# Patient Record
Sex: Male | Born: 1960 | Race: White | Hispanic: No | Smoking: Never smoker
Health system: Southern US, Community
[De-identification: ages and names within clinical notes are randomized; demographics above are authoritative.]

## PROBLEM LIST (undated history)

## (undated) DIAGNOSIS — A6000 Herpesviral infection of urogenital system, unspecified: Secondary | ICD-10-CM

## (undated) DIAGNOSIS — I10 Essential (primary) hypertension: Secondary | ICD-10-CM

## (undated) DIAGNOSIS — N200 Calculus of kidney: Secondary | ICD-10-CM

## (undated) DIAGNOSIS — Z87442 Personal history of urinary calculi: Secondary | ICD-10-CM

## (undated) DIAGNOSIS — E785 Hyperlipidemia, unspecified: Secondary | ICD-10-CM

## (undated) DIAGNOSIS — N210 Calculus in bladder: Secondary | ICD-10-CM

## (undated) HISTORY — PX: SEPTOPLASTY: SHX2393

## (undated) HISTORY — DX: Hyperlipidemia, unspecified: E78.5

## (undated) HISTORY — DX: Calculus of kidney: N20.0

## (undated) HISTORY — DX: Essential (primary) hypertension: I10

---

## 1996-03-02 HISTORY — PX: VASECTOMY: SHX75

## 2010-03-02 HISTORY — PX: HEMORRHOID SURGERY: SHX153

## 2011-08-03 ENCOUNTER — Ambulatory Visit: Payer: Self-pay | Admitting: Surgery

## 2011-08-03 LAB — CBC
HCT: 40.5 % (ref 40.0–52.0)
HGB: 13.9 g/dL (ref 13.0–18.0)
MCH: 30.5 pg (ref 26.0–34.0)
MCHC: 34.4 g/dL (ref 32.0–36.0)
MCV: 89 fL (ref 80–100)
Platelet: 165 10*3/uL (ref 150–440)
RBC: 4.55 10*6/uL (ref 4.40–5.90)
WBC: 7.5 10*3/uL (ref 3.8–10.6)

## 2014-06-24 NOTE — Op Note (Signed)
PATIENT NAME:  James, Rush MR#:  [id number] DATE OF BIRTH:  12-24-1960  DATE OF PROCEDURE:  08/03/2011  PREOPERATIVE DIAGNOSIS: Hemorrhoids.  POSTOPERATIVE DIAGNOSIS: Hemorrhoids.   PROCEDURE: Hemorrhoidectomy.   SURGEON: Rochel Brome, MD  ANESTHESIA: General.   INDICATION: This 54 year old male has history of anal pain and examination findings of large internal and external hemorrhoids and surgery was recommended for definitive treatment.   DESCRIPTION OF PROCEDURE: The patient was placed on the operating table in the supine position under general anesthesia. The legs were elevated into the lithotomy position using ankle straps. The scrotum was retracted with paper tape. The anal area was prepared with Betadine solution and draped with sterile towels and sheets.   Initial inspection revealed that there was a large external hemorrhoid seen on the patient's right side which extended from approximately the 7 o'clock  to the 11 o'clock position. There was another large hemorrhoid on the patient's left side which extended from the 2 o'clock to 4 o'clock position and another smaller hemorrhoid at approximately the 5:30 position. The anoderm was infiltrated with 0.5% Sensorcaine with epinephrine. The anal canal was dilated large enough to admit three fingers. The bivalve anal retractor was introduced. There was a large internal component to the hemorrhoid, on the patient's right side, and also the one on the left side and a smaller internal hemorrhoid component with the one at the 5:30 position.   First the hemorrhoid on the patient's right side was removed by placing a 2-0 chromic suture ligature just above the internal hemorrhoid. A V-shaped incision was made externally as the skin was scored with a scalpel. Next, electrocautery was used to dissect the external hemorrhoids away from the surrounding subcutaneous tissues. This dissection was carried up over the internal anal sphincter and  dissected up to the previously placed suture ligature. The hemorrhoid was ligated with the same suture ligature and then amputated. The wound was inspected. Several small bleeding points were cauterized. Hemostasis was subsequently intact. Next, the wound was closed with a running locked tied 2-0 chromic suture and left a small opening in the skin for drainage.  A similar procedure was carried out on the patient's left side from the 2 o'clock to 4 o'clock position with a similar suture ligature, incision, dissection, and excision. The wound was inspected. Multiple small bleeding points were cauterized. The wound was closed with running locked tied 2-0 chromic with a small opening for drainage.   Next, a smaller similar procedure was carried out at the 5:30 position using a 3-0 chromic suture with similar ligation, excision, and closure.   Next, as the anal retractor was removed, the anal area was again prepared with Betadine solution and subsequently injected the anoderm and subcutaneous tissues with 20 mL of Exparel. Following this dressings were applied with paper tape. The patient tolerated surgery satisfactorily and was then prepared for transfer to the recovery room. ___________________________ Lenna Sciara. Rochel Brome, MD jws:slb D: 08/03/2011 09:50:36 ET T: 08/03/2011 10:44:11 ET JOB#: [id number]  cc: Loreli Dollar, MD, <Dictator> Loreli Dollar MD ELECTRONICALLY SIGNED 08/04/2011 18:47

## 2014-08-06 ENCOUNTER — Encounter: Payer: Self-pay | Admitting: Family Medicine

## 2014-08-06 ENCOUNTER — Ambulatory Visit (INDEPENDENT_AMBULATORY_CARE_PROVIDER_SITE_OTHER): Payer: No Typology Code available for payment source | Admitting: Family Medicine

## 2014-08-06 VITALS — BP 128/85 | HR 73 | Temp 98.3°F | Wt 258.0 lb

## 2014-08-06 DIAGNOSIS — I10 Essential (primary) hypertension: Secondary | ICD-10-CM

## 2014-08-06 DIAGNOSIS — R7301 Impaired fasting glucose: Secondary | ICD-10-CM | POA: Diagnosis not present

## 2014-08-06 MED ORDER — CARVEDILOL 25 MG PO TABS: 25.0000 mg | ORAL_TABLET | Freq: Two times a day (BID) | ORAL | Status: DC

## 2014-08-06 MED ORDER — LOSARTAN POTASSIUM 100 MG PO TABS: 100.0000 mg | ORAL_TABLET | Freq: Every day | ORAL | Status: DC

## 2014-08-06 MED ORDER — AMLODIPINE BESYLATE 10 MG PO TABS: 10.0000 mg | ORAL_TABLET | Freq: Every day | ORAL | Status: DC

## 2014-08-06 NOTE — Patient Instructions (Addendum)
Your goal blood pressure is less than 140/90. Try to limit the sodium in your diet.  Ideally, consume less than 1.5 grams (less than 1,500mg ) per day. Do not add salt when cooking or at the table.  Check the sodium amount on labels when shopping, and choose items lower in sodium when given a choice. Eat a diet rich in fruits and vegetables and whole grains. Return in 6 months for blood pressure and prediabetes. Good luck with modest weight loss.  DASH Eating Plan DASH stands for "Dietary Approaches to Stop Hypertension." The DASH eating plan is a healthy eating plan that has been shown to reduce high blood pressure (hypertension). Additional health benefits may include reducing the risk of type 2 diabetes mellitus, heart disease, and stroke. The DASH eating plan may also help with weight loss. WHAT DO I NEED TO KNOW ABOUT THE DASH EATING PLAN? For the DASH eating plan, you will follow these general guidelines:  Choose foods with a percent daily value for sodium of less than 5% (as listed on the food label).  Use salt-free seasonings or herbs instead of table salt or sea salt.  Check with your health care provider or pharmacist before using salt substitutes.  Eat lower-sodium products, often labeled as "lower sodium" or "no salt added."  Eat fresh foods.  Eat more vegetables, fruits, and low-fat dairy products.  Choose whole grains. Look for the word "whole" as the first word in the ingredient list.  Choose fish and skinless chicken or [place] more often than red meat. Limit fish, poultry, and meat to 6 oz (170 g) each day.  Limit sweets, desserts, sugars, and sugary drinks.  Choose heart-healthy fats.  Limit cheese to 1 oz (28 g) per day.  Eat more home-cooked food and less restaurant, buffet, and fast food.  Limit fried foods.  Cook foods using methods other than frying.  Limit canned vegetables. If you do use them, rinse them well to decrease the sodium.  When eating at a  restaurant, ask that your food be prepared with less salt, or no salt if possible. WHAT FOODS CAN I EAT? Seek help from a dietitian for individual calorie needs. Grains Whole grain or whole wheat bread. Brown rice. Whole grain or whole wheat pasta. Quinoa, bulgur, and whole grain cereals. Low-sodium cereals. Corn or whole wheat flour tortillas. Whole grain cornbread. Whole grain crackers. Low-sodium crackers. Vegetables Fresh or frozen vegetables (raw, steamed, roasted, or grilled). Low-sodium or reduced-sodium tomato and vegetable juices. Low-sodium or reduced-sodium tomato sauce and paste. Low-sodium or reduced-sodium canned vegetables.  Fruits All fresh, canned (in natural juice), or frozen fruits. Meat and Other Protein Products Ground beef (85% or leaner), grass-fed beef, or beef trimmed of fat. Skinless chicken or [place]. Ground chicken or [place]. Pork trimmed of fat. All fish and seafood. Eggs. Dried beans, peas, or lentils. Unsalted nuts and seeds. Unsalted canned beans. Dairy Low-fat dairy products, such as skim or 1% milk, 2% or reduced-fat cheeses, low-fat ricotta or cottage cheese, or plain low-fat yogurt. Low-sodium or reduced-sodium cheeses. Fats and Oils Tub margarines without trans fats. Light or reduced-fat mayonnaise and salad dressings (reduced sodium). Avocado. Safflower, olive, or canola oils. Natural peanut or almond butter. Other Unsalted popcorn and pretzels. The items listed above may not be a complete list of recommended foods or beverages. Contact your dietitian for more options. WHAT FOODS ARE NOT RECOMMENDED? Grains White bread. White pasta. White rice. Refined cornbread. Bagels and croissants. Crackers that contain trans fat.  Vegetables Creamed or fried vegetables. Vegetables in a cheese sauce. Regular canned vegetables. Regular canned tomato sauce and paste. Regular tomato and vegetable juices. Fruits Dried fruits. Canned fruit in light or heavy syrup. Fruit  juice. Meat and Other Protein Products Fatty cuts of meat. Ribs, chicken wings, bacon, sausage, bologna, salami, chitterlings, fatback, hot dogs, bratwurst, and packaged luncheon meats. Salted nuts and seeds. Canned beans with salt. Dairy Whole or 2% milk, cream, half-and-half, and cream cheese. Whole-fat or sweetened yogurt. Full-fat cheeses or blue cheese. Nondairy creamers and whipped toppings. Processed cheese, cheese spreads, or cheese curds. Condiments Onion and garlic salt, seasoned salt, table salt, and sea salt. Canned and packaged gravies. Worcestershire sauce. Tartar sauce. Barbecue sauce. Teriyaki sauce. Soy sauce, including reduced sodium. Steak sauce. Fish sauce. Oyster sauce. Cocktail sauce. Horseradish. Ketchup and mustard. Meat flavorings and tenderizers. Bouillon cubes. Hot sauce. Tabasco sauce. Marinades. Taco seasonings. Relishes. Fats and Oils Butter, stick margarine, lard, shortening, ghee, and bacon fat. Coconut, palm kernel, or palm oils. Regular salad dressings. Other Pickles and olives. Salted popcorn and pretzels. The items listed above may not be a complete list of foods and beverages to avoid. Contact your dietitian for more information. WHERE CAN I FIND MORE INFORMATION? National Heart, Lung, and [employer] Institute: travelstabloid.com Document Released: 02/05/2011 Document Revised: 07/03/2013 Document Reviewed: 12/21/2012 [facility] Patient Information 2015 [place], [place]. This information is not intended to replace advice given to you by your health care provider. Make sure you discuss any questions you have with your health care provider.

## 2014-08-06 NOTE — Assessment & Plan Note (Signed)
Encouraged DASH guidelines BP under much better control with new regimen Check BMP today Return in 6 months for next visit

## 2014-08-06 NOTE — Assessment & Plan Note (Signed)
Reviewed previous glucose readings; it appears he has had prediabetes or impaired fasting glucose for about 5 years Work on weight loss, healthy eating Recheck glucose fasting and A1C in 6 months

## 2014-08-06 NOTE — Progress Notes (Signed)
Patient ID: James Rush, male   DOB: 1960-07-27, 54 y.o.   MRN: [id number]   Subjective:   James Rush is a 54 y.o. 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 Hyperlipidemia   . Hypertension    Past Surgical History  Procedure Laterality Date  . Vasectomy    . Septoplasty    . Hemorrhoid surgery     Family History  Problem Relation Age of Onset  . Diabetes Mother   . Hypertension Mother    History  Substance Use Topics  . Smoking status: Never Smoker   . Smokeless tobacco: Never Used  . Alcohol Use: No   No current outpatient prescriptions on file prior to visit.   No current facility-administered medications on file prior to visit.   No Known Allergies ------------- Review of Systems  Respiratory: Negative for shortness of breath.   Cardiovascular: Negative for chest pain.     Lab Results  Component Value Date   WBC 7.5 08/03/2011   HGB 13.9 08/03/2011   HCT 40.5 08/03/2011   PLT 165 08/03/2011    Objective:   Filed Vitals:   08/06/14 1257  BP: 128/85  Pulse: 73  Temp: 98.3 F (36.8 C)  Weight: 258 lb (117.028 kg)  SpO2: 96%   Body mass index is 38.08 kg/(m^2). Physical Exam  Constitutional: He appears well-developed and well-nourished.  Eyes: EOM are normal. No scleral icterus.  Cardiovascular: Normal rate, regular rhythm and normal heart sounds.   No murmur heard. Pulmonary/Chest:  Effort normal and breath sounds normal. No respiratory distress.  Abdominal: He exhibits no distension.  Musculoskeletal: He exhibits no edema.  Skin: No rash noted. No pallor.  Psychiatric: He has a normal mood and affect.    Assessment/Plan:   Problem List Items Addressed This Visit      Cardiovascular and Mediastinum   Benign hypertension - Primary    Encouraged DASH guidelines BP under much better control with new regimen Check BMP today Return in 6 months for next visit      Relevant Medications   amLODipine (NORVASC) 10 MG tablet   carvedilol (COREG) 25 MG tablet   losartan (COZAAR) 100 MG tablet   Other Relevant Orders   Basic Metabolic Panel (BMET)     Endocrine   Impaired fasting glucose       Meds ordered this encounter  Medications  . amLODipine (NORVASC) 10 MG tablet    Sig: Take 1 tablet (10 mg total) by mouth daily.    Dispense:  90 tablet    Refill:  3  . carvedilol (COREG) 25 MG tablet    Sig: Take 1 tablet (25 mg total) by mouth 2 (two) times daily with a meal.    Dispense:  180 tablet    Refill:  1  . losartan (COZAAR) 100 MG tablet    Sig: Take 1 tablet (100 mg total)  by mouth daily.    Dispense:  90 tablet    Refill:  1    Follow up plan: Return in about 6 months (around 02/05/2015) for high blood pressure.   An After Visit Summary was printed and given to the patient.

## 2014-08-07 ENCOUNTER — Encounter: Payer: Self-pay | Admitting: Family Medicine

## 2014-08-07 LAB — BASIC METABOLIC PANEL
BUN/Creatinine Ratio: 10 (ref 9–20)
BUN: 11 mg/dL (ref 6–24)
CALCIUM: 9.6 mg/dL (ref 8.7–10.2)
CHLORIDE: 104 mmol/L (ref 97–108)
CO2: 21 mmol/L (ref 18–29)
CREATININE: 1.11 mg/dL (ref 0.76–1.27)
GFR calc non Af Amer: 74 mL/min/{1.73_m2} (ref >59)
GFR, EST AFRICAN AMERICAN: 86 mL/min/{1.73_m2} (ref >59)
Glucose: 90 mg/dL (ref 65–99)
Potassium: 4.5 mmol/L (ref 3.5–5.2)
Sodium: 140 mmol/L (ref 134–144)

## 2015-01-23 ENCOUNTER — Ambulatory Visit (INDEPENDENT_AMBULATORY_CARE_PROVIDER_SITE_OTHER): Payer: No Typology Code available for payment source | Admitting: Family Medicine

## 2015-01-23 ENCOUNTER — Encounter: Payer: Self-pay | Admitting: Family Medicine

## 2015-01-23 VITALS — BP 123/81 | HR 63 | Temp 98.2°F | Ht 68.0 in | Wt 249.0 lb

## 2015-01-23 DIAGNOSIS — R7301 Impaired fasting glucose: Secondary | ICD-10-CM | POA: Diagnosis not present

## 2015-01-23 DIAGNOSIS — Z23 Encounter for immunization: Secondary | ICD-10-CM

## 2015-01-23 DIAGNOSIS — I10 Essential (primary) hypertension: Secondary | ICD-10-CM

## 2015-01-23 MED ORDER — AMLODIPINE BESYLATE 10 MG PO TABS: 10.0000 mg | ORAL_TABLET | Freq: Every day | ORAL | Status: DC

## 2015-01-23 MED ORDER — LOSARTAN POTASSIUM 100 MG PO TABS: 100.0000 mg | ORAL_TABLET | Freq: Every day | ORAL | Status: DC

## 2015-01-23 MED ORDER — CARVEDILOL 25 MG PO TABS: 25.0000 mg | ORAL_TABLET | Freq: Two times a day (BID) | ORAL | Status: DC

## 2015-01-23 NOTE — Assessment & Plan Note (Signed)
The current medical regimen is effective;  continue present plan and medications.  

## 2015-01-23 NOTE — Assessment & Plan Note (Signed)
Discuss elevated sugars will recheck BMP today with attention to sugar Continue weight loss

## 2015-01-23 NOTE — Progress Notes (Signed)
BP 123/81 mmHg  Pulse 63  Temp(Src) 98.2 F (36.8 C)  Ht 5\' 8"  (1.727 m)  Wt 249 lb (112.946 kg)  BMI 37.87 kg/m2  SpO2 96%   Subjective:    Patient ID: James Rush, male    DOB: 16-Jun-1960, 54 y.o.   MRN: [id number]  HPI: James Rush is a 54 y.o. male  Chief Complaint  Patient presents with  . Hypertension   Patient taking blood pressure medicine without problems or side effects and taking faithfully  Patient is lost 11 pounds since this spring Concerned about elevated blood sugar last visit Patient with no other symptoms or complaints  Relevant past medical, surgical, family and social history reviewed and updated as indicated. Interim medical history since our last visit reviewed. Allergies and medications reviewed and updated.  Review of Systems  Constitutional: Negative.   Respiratory: Negative.   Cardiovascular: Negative.     Per HPI unless specifically indicated above     Objective:    BP 123/81 mmHg  Pulse 63  Temp(Src) 98.2 F (36.8 C)  Ht 5\' 8"  (1.727 m)  Wt 249 lb (112.946 kg)  BMI 37.87 kg/m2  SpO2 96%  Wt Readings from Last 3 Encounters:  01/23/15 249 lb (112.946 kg)  08/06/14 258 lb (117.028 kg)  07/16/14 260 lb (117.935 kg)    Physical Exam  Constitutional: He is oriented to person, place, and time. He appears well-developed and well-nourished. No distress.  HENT:  Head: Normocephalic and atraumatic.  Right Ear: Hearing normal.  Left Ear: Hearing normal.  Nose: Nose normal.  Eyes: Conjunctivae and lids are normal. Right eye exhibits no discharge. Left eye exhibits no discharge. No scleral icterus.  Cardiovascular: Normal rate, regular rhythm and normal heart sounds.   Pulmonary/Chest: Effort normal and breath sounds normal. No respiratory distress.  Musculoskeletal: Normal range of motion.  Neurological: He is alert and oriented to person, place, and time.  Skin: Skin is intact. No rash noted.  Psychiatric: He has a  normal mood and affect. His speech is normal and behavior is normal. Judgment and thought content normal. Cognition and memory are normal.    Results for orders placed or performed in visit on [id number]  Basic Metabolic Panel (BMET)  Result Value Ref Range   Glucose 90 65 - 99 mg/dL   BUN 11 6 - 24 mg/dL   Creatinine, Ser 1.11 0.76 - 1.27 mg/dL   GFR calc non Af Amer 74 >59 mL/min/1.73   GFR calc Af Amer 86 >59 mL/min/1.73   BUN/Creatinine Ratio 10 9 - 20   Sodium 140 134 - 144 mmol/L   Potassium 4.5 3.5 - 5.2 mmol/L   Chloride 104 97 - 108 mmol/L   CO2 21 18 - 29 mmol/L   Calcium 9.6 8.7 - 10.2 mg/dL      Assessment & Plan:   Problem List Items Addressed This Visit      Cardiovascular and Mediastinum   Essential hypertension    The current medical regimen is effective;  continue present plan and medications.       Relevant Medications   carvedilol (COREG) 25 MG tablet   losartan (COZAAR) 100 MG tablet   amLODipine (NORVASC) 10 MG tablet   Other Relevant Orders   Basic metabolic panel     Endocrine   Impaired fasting glucose    Discuss elevated sugars will recheck BMP today with attention to sugar Continue weight loss       Other  Visit Diagnoses    Immunization due    -  Primary    Relevant Orders    Flu Vaccine QUAD 36+ mos PF IM (Fluarix & Fluzone Quad PF) (Completed)    Benign hypertension        Relevant Medications    carvedilol (COREG) 25 MG tablet    losartan (COZAAR) 100 MG tablet    amLODipine (NORVASC) 10 MG tablet        Follow up plan: Return for Physical Exam.

## 2015-01-24 LAB — BASIC METABOLIC PANEL
BUN/Creatinine Ratio: 12 (ref 9–20)
BUN: 13 mg/dL (ref 6–24)
CO2: 25 mmol/L (ref 18–29)
CREATININE: 1.1 mg/dL (ref 0.76–1.27)
Calcium: 9.5 mg/dL (ref 8.7–10.2)
Chloride: 102 mmol/L (ref 97–106)
GFR calc Af Amer: 87 mL/min/{1.73_m2} (ref >59)
GFR calc non Af Amer: 75 mL/min/{1.73_m2} (ref >59)
Glucose: 88 mg/dL (ref 65–99)
Potassium: 4.5 mmol/L (ref 3.5–5.2)
SODIUM: 141 mmol/L (ref 136–144)

## 2015-08-14 ENCOUNTER — Other Ambulatory Visit: Payer: Self-pay | Admitting: Family Medicine

## 2015-08-29 ENCOUNTER — Encounter: Payer: Self-pay | Admitting: Family Medicine

## 2015-08-29 ENCOUNTER — Ambulatory Visit (INDEPENDENT_AMBULATORY_CARE_PROVIDER_SITE_OTHER): Payer: BLUE CROSS/BLUE SHIELD | Admitting: Family Medicine

## 2015-08-29 VITALS — BP 146/88 | HR 62 | Temp 98.4°F | Wt 257.0 lb

## 2015-08-29 DIAGNOSIS — H578 Other specified disorders of eye and adnexa: Secondary | ICD-10-CM

## 2015-08-29 DIAGNOSIS — H5789 Other specified disorders of eye and adnexa: Secondary | ICD-10-CM

## 2015-08-29 DIAGNOSIS — R238 Other skin changes: Secondary | ICD-10-CM | POA: Diagnosis not present

## 2015-08-29 DIAGNOSIS — I10 Essential (primary) hypertension: Secondary | ICD-10-CM

## 2015-08-29 MED ORDER — OFLOXACIN 0.3 % OP SOLN: 1.0000 [drp] | Freq: Four times a day (QID) | OPHTHALMIC | Status: DC

## 2015-08-29 MED ORDER — VALACYCLOVIR HCL 1 G PO TABS: 2000.0000 mg | ORAL_TABLET | Freq: Two times a day (BID) | ORAL | Status: DC

## 2015-08-29 NOTE — Progress Notes (Signed)
BP 146/88 mmHg  Pulse 62  Temp(Src) 98.4 F (36.9 C)  Wt 257 lb (116.574 kg)  SpO2 97%   Subjective:    Patient ID: James Rush, male    DOB: Jun 21, 1960, 55 y.o.   MRN: [id number]  HPI: James Rush is a 55 y.o. male  Chief Complaint  Patient presents with  . Eye Problem    left eye off and on for 6 months. Occasionally on the right eye. Will swell and the whites of eye starts to "bubble up". He would like to be tested for HSV.  Marland Kitchen Herpes    he's never been tested before, but thinks he has it and that might be what he has in his eye. He has had several outbreaks in his genital area.    Eye - Patient c/o left eye redness, gritty feeling, conjunctival blisters, and blurry vision. States he often hsa crust around his eyes when he wakes up in the morning during flares. 3rd episode in 6 months with left eye involvement. Wears contacts, which irritate eyes further. Episodes last a few days each time.  Is going back to ophthalmologist next month. Does not have contacts in today.   Blisters - 2-3 years ago, started noticing flares several times a year with blisters on his scrotum that last a few days before scabbing over and dissipate within a month. States they are sore and are painful. Denies having any outbreaks at this time. Also has had cold sores for years (as far back as he can remember).   HTN - Also admits to missing a dose of carvedilol nearly every day since it's the only one that's twice a day, feels that is why his BP is up. Patient trying to start doing better with this. Denies CP or dizziness.   Relevant past medical, surgical, family and social history reviewed and updated as indicated. Interim medical history since our last visit reviewed. Allergies and medications reviewed and updated.  Review of Systems  Constitutional: Negative.  Negative for fever and chills.  HENT: Negative for congestion, facial swelling and rhinorrhea.   Eyes: Positive for photophobia  (mild light sensitivity), pain (gritty feeling in left eye), discharge (clear discharge from left eye), redness and visual disturbance (blurry vision).  Respiratory: Negative.   Cardiovascular: Negative.   Genitourinary: Negative.   Musculoskeletal: Negative.   Neurological: Negative.  Negative for headaches.  Psychiatric/Behavioral: Negative.     Per HPI unless specifically indicated above     Objective:    BP 146/88 mmHg  Pulse 62  Temp(Src) 98.4 F (36.9 C)  Wt 257 lb (116.574 kg)  SpO2 97%  Wt Readings from Last 3 Encounters:  08/29/15 257 lb (116.574 kg)  01/23/15 249 lb (112.946 kg)  08/06/14 258 lb (117.028 kg)    Physical Exam  Constitutional: He is oriented to person, place, and time. He appears well-developed and well-nourished. No distress.  HENT:  Head: Atraumatic.  Nose: Nose normal.  Mouth/Throat: Oropharynx is clear and moist.  Eyes: EOM are normal. Pupils are equal, round, and reactive to light. Left eye exhibits discharge (Clear dicsharge ). No scleral icterus.  No redness or swelling present on eyelids - b/l eyes Right eye appears normal Left eye with diffusely red, inflamed cornea  Neck: Neck supple.  Cardiovascular: Normal rate, regular rhythm and normal heart sounds.   Pulmonary/Chest: Effort normal and breath sounds normal. No respiratory distress.  Abdominal: Soft.  Genitourinary:  **Patient declined scrotal exam as he  states he does not currently have any blistering   Musculoskeletal: Normal range of motion. He exhibits no edema or tenderness.  Lymphadenopathy:    He has no cervical adenopathy.  Neurological: He is alert and oriented to person, place, and time.  Skin: Skin is warm and dry. No rash noted.  Psychiatric: He has a normal mood and affect. His behavior is normal.    Results for orders placed or performed in visit on [id number]  Basic metabolic panel  Result Value Ref Range   Glucose 88 65 - 99 mg/dL   BUN 13 6 - 24 mg/dL    Creatinine, Ser 1.10 0.76 - 1.27 mg/dL   GFR calc non Af Amer 75 >59 mL/min/1.73   GFR calc Af Amer 87 >59 mL/min/1.73   BUN/Creatinine Ratio 12 9 - 20   Sodium 141 136 - 144 mmol/L   Potassium 4.5 3.5 - 5.2 mmol/L   Chloride 102 97 - 106 mmol/L   CO2 25 18 - 29 mmol/L   Calcium 9.5 8.7 - 10.2 mg/dL      Assessment & Plan:   Problem List Items Addressed This Visit      Cardiovascular and Mediastinum   Essential hypertension    Other Visit Diagnoses    Redness of eye, left    -  Primary    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 Patient due to see opthamologist next month for dilated eye exam.    Blisters of multiple sites        Likely HSV, await lab results. Advised patient to empirically treat with valtrex for 1-2 days until then.     Relevant Orders    HSV(herpes simplex vrs) 1+2 ab-IgG     In case of possible contact lens conjunctivitis component, sent in some ofloxacin drops for him to use during flares. Discussed contact lens hygiene with patient, especially making sure not to sleep in them overnight.    Follow up plan: Return if symptoms worsen or fail to improve.

## 2015-08-29 NOTE — Patient Instructions (Signed)
Call if symptoms don't resolve or worsen. Follow up as soon as possible with opthamologist.

## 2015-08-30 ENCOUNTER — Telehealth: Payer: Self-pay | Admitting: Family Medicine

## 2015-08-30 ENCOUNTER — Other Ambulatory Visit: Payer: Self-pay | Admitting: Family Medicine

## 2015-08-30 DIAGNOSIS — B009 Herpesviral infection, unspecified: Secondary | ICD-10-CM | POA: Insufficient documentation

## 2015-08-30 LAB — HSV(HERPES SIMPLEX VRS) I + II AB-IGG
HSV 1 GLYCOPROTEIN G AB, IGG: 36.7 {index} — AB (ref 0.00–0.90)
HSV 2 Glycoprotein G Ab, IgG: 15.2 index — ABNORMAL HIGH (ref 0.00–0.90)

## 2015-08-30 MED ORDER — VALACYCLOVIR HCL 1 G PO TABS: 1000.0000 mg | ORAL_TABLET | Freq: Every day | ORAL | Status: DC

## 2015-08-30 NOTE — Telephone Encounter (Signed)
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 Recommended he continue as dicussed at visit with suppressive valtrex dosing 2G BID for two days, and I have sent a second script to his pharmacy for maintenance therapy of 1G QD that he is to take regularly. Encouraged patient to move Opthalmology appointment up to as soon as possible next month to address possible HS Keratitis. He is agreeable and understands current plan.

## 2015-11-03 ENCOUNTER — Other Ambulatory Visit: Payer: Self-pay | Admitting: Family Medicine

## 2015-11-05 NOTE — Telephone Encounter (Signed)
Your patient 

## 2016-02-05 ENCOUNTER — Other Ambulatory Visit: Payer: Self-pay | Admitting: Family Medicine

## 2016-02-05 DIAGNOSIS — I10 Essential (primary) hypertension: Secondary | ICD-10-CM

## 2016-02-05 NOTE — Telephone Encounter (Signed)
apt 

## 2016-02-12 ENCOUNTER — Ambulatory Visit: Payer: BLUE CROSS/BLUE SHIELD | Admitting: Family Medicine

## 2016-02-12 ENCOUNTER — Ambulatory Visit (INDEPENDENT_AMBULATORY_CARE_PROVIDER_SITE_OTHER): Payer: BLUE CROSS/BLUE SHIELD | Admitting: Family Medicine

## 2016-02-12 ENCOUNTER — Encounter: Payer: Self-pay | Admitting: Family Medicine

## 2016-02-12 VITALS — BP 123/79 | HR 68 | Temp 98.1°F | Ht 70.0 in | Wt 261.6 lb

## 2016-02-12 DIAGNOSIS — R7301 Impaired fasting glucose: Secondary | ICD-10-CM | POA: Diagnosis not present

## 2016-02-12 DIAGNOSIS — Z23 Encounter for immunization: Secondary | ICD-10-CM

## 2016-02-12 DIAGNOSIS — Z6837 Body mass index (BMI) 37.0-37.9, adult: Secondary | ICD-10-CM | POA: Diagnosis not present

## 2016-02-12 DIAGNOSIS — I10 Essential (primary) hypertension: Secondary | ICD-10-CM

## 2016-02-12 DIAGNOSIS — Z0001 Encounter for general adult medical examination with abnormal findings: Secondary | ICD-10-CM

## 2016-02-12 DIAGNOSIS — Z0000 Encounter for general adult medical examination without abnormal findings: Secondary | ICD-10-CM

## 2016-02-12 LAB — MICROSCOPIC EXAMINATION: BACTERIA UA: NONE SEEN

## 2016-02-12 LAB — URINALYSIS, ROUTINE W REFLEX MICROSCOPIC
Bilirubin, UA: NEGATIVE
Glucose, UA: NEGATIVE
Ketones, UA: NEGATIVE
Nitrite, UA: NEGATIVE
PH UA: 6 (ref 5.0–7.5)
PROTEIN UA: NEGATIVE
Specific Gravity, UA: <1.005 — ABNORMAL LOW (ref 1.005–1.030)
UUROB: 0.2 mg/dL (ref 0.2–1.0)

## 2016-02-12 MED ORDER — LOSARTAN POTASSIUM 100 MG PO TABS: 100.0000 mg | ORAL_TABLET | Freq: Every day | ORAL | 4 refills | Status: DC

## 2016-02-12 MED ORDER — AMLODIPINE BESYLATE 10 MG PO TABS: 10.0000 mg | ORAL_TABLET | Freq: Every day | ORAL | 4 refills | Status: DC

## 2016-02-12 MED ORDER — CARVEDILOL 25 MG PO TABS: 25.0000 mg | ORAL_TABLET | Freq: Two times a day (BID) | ORAL | 4 refills | Status: DC

## 2016-02-12 NOTE — Progress Notes (Signed)
BP 123/79 (BP Location: Left Arm, Patient Position: Sitting, Cuff Size: Normal)   Pulse 68   Temp 98.1 F (36.7 C)   Ht 5\' 10"  (1.778 m)   Wt 261 lb 9.6 oz (118.7 kg)   SpO2 95%   BMI 37.54 kg/m    Subjective:    Patient ID: James Rush, male    DOB: 06/08/1960, 55 y.o.   MRN: [id number]  HPI: James Rush is a 55 y.o. male  Chief Complaint  Patient presents with  . Blood Pressure Check  . Medication Refill  Annual wellness exam Patient follow-up blood pressure taking blood pressure medications without problems or issues. Good blood pressure control. Discuss with patient 22 pound weight gain over this last year. Patient not drinking or other metabolic issues no musculoskeletal issues keeping him from moving patient admits to a just like to eat.  Relevant past medical, surgical, family and social history reviewed and updated as indicated. Interim medical history since our last visit reviewed. Allergies and medications reviewed and updated.  Review of Systems  Constitutional: Negative.   HENT: Negative.   Eyes: Negative.   Respiratory: Negative.   Cardiovascular: Negative.   Gastrointestinal: Negative.   Endocrine: Negative.   Genitourinary: Negative.   Musculoskeletal: Negative.   Skin: Negative.   Allergic/Immunologic: Negative.   Neurological: Negative.   Hematological: Negative.   Psychiatric/Behavioral: Negative.     Per HPI unless specifically indicated above     Objective:    BP 123/79 (BP Location: Left Arm, Patient Position: Sitting, Cuff Size: Normal)   Pulse 68   Temp 98.1 F (36.7 C)   Ht 5\' 10"  (1.778 m)   Wt 261 lb 9.6 oz (118.7 kg)   SpO2 95%   BMI 37.54 kg/m   Wt Readings from Last 3 Encounters:  02/12/16 261 lb 9.6 oz (118.7 kg)  08/29/15 257 lb (116.6 kg)  01/23/15 249 lb (112.9 kg)    Physical Exam  Constitutional: He is oriented to person, place, and time. He appears well-developed and well-nourished.  HENT:  Head:  Normocephalic and atraumatic.  Right Ear: External ear normal.  Left Ear: External ear normal.  Eyes: Conjunctivae and EOM are normal. Pupils are equal, round, and reactive to light.  Neck: Normal range of motion. Neck supple.  Cardiovascular: Normal rate, regular rhythm, normal heart sounds and intact distal pulses.   Pulmonary/Chest: Effort normal and breath sounds normal.  Abdominal: Soft. Bowel sounds are normal. There is no splenomegaly or hepatomegaly.  Genitourinary: Rectum normal, prostate normal and penis normal.  Musculoskeletal: Normal range of motion.  Neurological: He is alert and oriented to person, place, and time. He has normal reflexes.  Skin: No rash noted. No erythema.  Psychiatric: He has a normal mood and affect. His behavior is normal. Judgment and thought content normal.    Results for orders placed or performed in visit on 08/29/15  HSV(herpes simplex vrs) 1+2 ab-IgG  Result Value Ref Range   HSV 1 Glycoprotein G Ab, IgG 36.70 (H) 0.00 - 0.90 index   HSV 2 Glycoprotein G Ab, IgG 15.20 (H) 0.00 - 0.90 index      Assessment & Plan:   Problem List Items Addressed This Visit      Cardiovascular and Mediastinum   Essential hypertension    The current medical regimen is effective;  continue present plan and medications.       Relevant Medications   aspirin EC 81 MG tablet   losartan (  COZAAR) 100 MG tablet   carvedilol (COREG) 25 MG tablet   amLODipine (NORVASC) 10 MG tablet     Endocrine   Impaired fasting glucose    Labs pending        Other   BMI 37.0-37.9, adult    WT LOSS ESP WITH CONCERN ABOUT PREDIABETES       Other Visit Diagnoses    Need for tetanus booster    -  Primary   Relevant Orders   Td vaccine greater than or equal to 7yo preservative free IM (Completed)   Need for influenza vaccination       Relevant Orders   Flu Vaccine QUAD 36+ mos PF IM (Fluarix & Fluzone Quad PF) (Completed)   PE (physical exam), annual       Relevant  Orders   Comprehensive metabolic panel   Lipid panel   CBC with Differential/Platelet   TSH   Urinalysis, Routine w reflex microscopic   PSA       Follow up plan: Return in about 6 months (around 08/12/2016) for BMP.

## 2016-02-12 NOTE — Assessment & Plan Note (Signed)
The current medical regimen is effective;  continue present plan and medications.  

## 2016-02-12 NOTE — Assessment & Plan Note (Signed)
Labs pending.  

## 2016-02-12 NOTE — Assessment & Plan Note (Signed)
WT LOSS ESP WITH CONCERN ABOUT PREDIABETES

## 2016-02-13 LAB — CBC WITH DIFFERENTIAL/PLATELET
BASOS ABS: 0 10*3/uL (ref 0.0–0.2)
Basos: 0 %
EOS (ABSOLUTE): 0.2 10*3/uL (ref 0.0–0.4)
EOS: 3 %
HEMATOCRIT: 43 % (ref 37.5–51.0)
HEMOGLOBIN: 14.9 g/dL (ref 13.0–17.7)
Immature Grans (Abs): 0 10*3/uL (ref 0.0–0.1)
Immature Granulocytes: 0 %
LYMPHS ABS: 1.6 10*3/uL (ref 0.7–3.1)
LYMPHS: 27 %
MCH: 30.5 pg (ref 26.6–33.0)
MCHC: 34.7 g/dL (ref 31.5–35.7)
MCV: 88 fL (ref 79–97)
MONOCYTES: 11 %
Monocytes Absolute: 0.6 10*3/uL (ref 0.1–0.9)
NEUTROS PCT: 59 %
Neutrophils Absolute: 3.4 10*3/uL (ref 1.4–7.0)
Platelets: 199 10*3/uL (ref 150–379)
RBC: 4.89 x10E6/uL (ref 4.14–5.80)
RDW: 13.6 % (ref 12.3–15.4)
WBC: 5.9 10*3/uL (ref 3.4–10.8)

## 2016-02-13 LAB — LIPID PANEL
CHOL/HDL RATIO: 4.4 ratio (ref 0.0–5.0)
Cholesterol, Total: 166 mg/dL (ref 100–199)
HDL: 38 mg/dL — AB (ref >39)
LDL Calculated: 85 mg/dL (ref 0–99)
Triglycerides: 215 mg/dL — ABNORMAL HIGH (ref 0–149)
VLDL Cholesterol Cal: 43 mg/dL — ABNORMAL HIGH (ref 5–40)

## 2016-02-13 LAB — COMPREHENSIVE METABOLIC PANEL
A/G RATIO: 1.4 (ref 1.2–2.2)
ALT: 33 IU/L (ref 0–44)
AST: 22 IU/L (ref 0–40)
Albumin: 4.2 g/dL (ref 3.5–5.5)
Alkaline Phosphatase: 65 IU/L (ref 39–117)
BUN / CREAT RATIO: 10 (ref 9–20)
BUN: 10 mg/dL (ref 6–24)
Bilirubin Total: 0.8 mg/dL (ref 0.0–1.2)
CALCIUM: 9.4 mg/dL (ref 8.7–10.2)
CHLORIDE: 102 mmol/L (ref 96–106)
CO2: 27 mmol/L (ref 18–29)
Creatinine, Ser: 1.04 mg/dL (ref 0.76–1.27)
GFR, EST AFRICAN AMERICAN: 92 mL/min/{1.73_m2} (ref >59)
GFR, EST NON AFRICAN AMERICAN: 80 mL/min/{1.73_m2} (ref >59)
GLOBULIN, TOTAL: 2.9 g/dL (ref 1.5–4.5)
Glucose: 121 mg/dL — ABNORMAL HIGH (ref 65–99)
POTASSIUM: 4.6 mmol/L (ref 3.5–5.2)
Sodium: 142 mmol/L (ref 134–144)
TOTAL PROTEIN: 7.1 g/dL (ref 6.0–8.5)

## 2016-02-13 LAB — PSA: PROSTATE SPECIFIC AG, SERUM: 2.5 ng/mL (ref 0.0–4.0)

## 2016-02-13 LAB — TSH: TSH: 1.82 u[IU]/mL (ref 0.450–4.500)

## 2016-02-18 ENCOUNTER — Telehealth: Payer: Self-pay | Admitting: Family Medicine

## 2016-02-18 NOTE — Telephone Encounter (Signed)
Patient said he has received several calls from the office and he feels someone is trying to reach him with the results from his labs last week.    Thanks Santiago Glad

## 2016-02-18 NOTE — Telephone Encounter (Signed)
We did try to call the patient yesterday about labs. There is a result note from Dr. Jeananne Rama to call patient about labs. Will do that and route result note back to him for documentation.

## 2016-02-19 ENCOUNTER — Telehealth: Payer: Self-pay | Admitting: Family Medicine

## 2016-02-19 NOTE — Telephone Encounter (Signed)
Phone call reviewed patient's labs which are normal except for slightly elevated glucose patient was nonfasting.

## 2016-02-19 NOTE — Telephone Encounter (Signed)
-----   Message from Moss Mc, [place] sent at 02/19/2016  5:10 PM EST ----- Phone call.

## 2017-01-18 ENCOUNTER — Encounter: Payer: Self-pay | Admitting: Family Medicine

## 2017-01-18 ENCOUNTER — Ambulatory Visit (INDEPENDENT_AMBULATORY_CARE_PROVIDER_SITE_OTHER): Payer: BLUE CROSS/BLUE SHIELD | Admitting: Family Medicine

## 2017-01-18 VITALS — BP 142/76 | HR 84 | Ht 70.0 in | Wt 241.0 lb

## 2017-01-18 DIAGNOSIS — R319 Hematuria, unspecified: Secondary | ICD-10-CM

## 2017-01-18 DIAGNOSIS — I10 Essential (primary) hypertension: Secondary | ICD-10-CM | POA: Diagnosis not present

## 2017-01-18 DIAGNOSIS — Z23 Encounter for immunization: Secondary | ICD-10-CM

## 2017-01-18 LAB — URINALYSIS, ROUTINE W REFLEX MICROSCOPIC
BILIRUBIN UA: NEGATIVE
Glucose, UA: NEGATIVE
KETONES UA: NEGATIVE
Nitrite, UA: NEGATIVE
Protein, UA: NEGATIVE
UUROB: 0.2 mg/dL (ref 0.2–1.0)
pH, UA: 6 (ref 5.0–7.5)

## 2017-01-18 LAB — MICROSCOPIC EXAMINATION: Bacteria, UA: NONE SEEN

## 2017-01-18 MED ORDER — OLMESARTAN-AMLODIPINE-HCTZ 40-10-25 MG PO TABS: 1.0000 | ORAL_TABLET | Freq: Every morning | ORAL | 3 refills | Status: DC

## 2017-01-18 NOTE — Assessment & Plan Note (Signed)
Poor control and interested in changing medications. Will change to tribenzor 40-10-25 And recheck 1 month or so with BMP to assess blood pressure

## 2017-01-18 NOTE — Assessment & Plan Note (Signed)
For hematuria which isn't showing up today will refer to urology to further evaluate.

## 2017-01-18 NOTE — Progress Notes (Signed)
BP (!) 142/76   Pulse 84   Ht 5\' 10"  (1.778 m)   Wt 214 lb (97.1 kg)   SpO2 98%   BMI 30.71 kg/m    Subjective:    Patient ID: James Rush, male    DOB: Jul 25, 1960, 56 y.o.   MRN: [id number]  HPI: James Rush is a 56 y.o. male  Chief Complaint  Patient presents with  . Follow-up   Discuss hypertension patient having trouble taking carvedilol twice a day remembers the morning pill but not the evening pill.Blood pressures been elevated. Patient interested in tribenzor Patient also with hematuria after  Doing heavy lifting primarily moving a bunch of cut logs. This is happened 2 times patient's also had smelly urine. Patient has been doing low carbohydrate die  And maybe some ketosis.  Relevant past medical, surgical, family and social history reviewed and updated as indicated. Interim medical history since our last visit reviewed. Allergies and medications reviewed and updated.  Review of Systems  Constitutional: Negative.   Respiratory: Negative.   Cardiovascular: Negative.     Per HPI unless specifically indicated above     Objective:    BP (!) 142/76   Pulse 84   Ht 5\' 10"  (1.778 m)   Wt 214 lb (97.1 kg)   SpO2 98%   BMI 30.71 kg/m   Wt Readings from Last 3 Encounters:  01/18/17 214 lb (97.1 kg)  02/12/16 261 lb 9.6 oz (118.7 kg)  08/29/15 257 lb (116.6 kg)    Physical Exam  Constitutional: He is oriented to person, place, and time. He appears well-developed and well-nourished.  HENT:  Head: Normocephalic and atraumatic.  Eyes: Conjunctivae and EOM are normal.  Neck: Normal range of motion.  Cardiovascular: Normal rate, regular rhythm and normal heart sounds.  Pulmonary/Chest: Effort normal and breath sounds normal.  Musculoskeletal: Normal range of motion.  Neurological: He is alert and oriented to person, place, and time.  Skin: No erythema.  Psychiatric: He has a normal mood and affect. His behavior is normal. Judgment and thought  content normal.    Results for orders placed or performed in visit on 02/12/16  Microscopic Examination  Result Value Ref Range   WBC, UA 0-5 0 - 5 /hpf   RBC, UA 0-2 0 - 2 /hpf   Epithelial Cells (non renal) 0-10 0 - 10 /hpf   Bacteria, UA None seen None seen/Few  Comprehensive metabolic panel  Result Value Ref Range   Glucose 121 (H) 65 - 99 mg/dL   BUN 10 6 - 24 mg/dL   Creatinine, Ser 1.04 0.76 - 1.27 mg/dL   GFR calc non Af Amer 80 >59 mL/min/1.73   GFR calc Af Amer 92 >59 mL/min/1.73   BUN/Creatinine Ratio 10 9 - 20   Sodium 142 134 - 144 mmol/L   Potassium 4.6 3.5 - 5.2 mmol/L   Chloride 102 96 - 106 mmol/L   CO2 27 18 - 29 mmol/L   Calcium 9.4 8.7 - 10.2 mg/dL   Total Protein 7.1 6.0 - 8.5 g/dL   Albumin 4.2 3.5 - 5.5 g/dL   Globulin, Total 2.9 1.5 - 4.5 g/dL   Albumin/Globulin Ratio 1.4 1.2 - 2.2   Bilirubin Total 0.8 0.0 - 1.2 mg/dL   Alkaline Phosphatase 65 39 - 117 IU/L   AST 22 0 - 40 IU/L   ALT 33 0 - 44 IU/L  Lipid panel  Result Value Ref Range   Cholesterol, Total 166  100 - 199 mg/dL   Triglycerides 215 (H) 0 - 149 mg/dL   HDL 38 (L) >39 mg/dL   VLDL Cholesterol Cal 43 (H) 5 - 40 mg/dL   LDL Calculated 85 0 - 99 mg/dL   Chol/HDL Ratio 4.4 0.0 - 5.0 ratio units  CBC with Differential/Platelet  Result Value Ref Range   WBC 5.9 3.4 - 10.8 x10E3/uL   RBC 4.89 4.14 - 5.80 x10E6/uL   Hemoglobin 14.9 13.0 - 17.7 g/dL   Hematocrit 43.0 37.5 - 51.0 %   MCV 88 79 - 97 fL   MCH 30.5 26.6 - 33.0 pg   MCHC 34.7 31.5 - 35.7 g/dL   RDW 13.6 12.3 - 15.4 %   Platelets 199 150 - 379 x10E3/uL   Neutrophils 59 Not Estab. %   Lymphs 27 Not Estab. %   Monocytes 11 Not Estab. %   Eos 3 Not Estab. %   Basos 0 Not Estab. %   Neutrophils Absolute 3.4 1.4 - 7.0 x10E3/uL   Lymphocytes Absolute 1.6 0.7 - 3.1 x10E3/uL   Monocytes Absolute 0.6 0.1 - 0.9 x10E3/uL   EOS (ABSOLUTE) 0.2 0.0 - 0.4 x10E3/uL   Basophils Absolute 0.0 0.0 - 0.2 x10E3/uL   Immature Granulocytes 0  Not Estab. %   Immature Grans (Abs) 0.0 0.0 - 0.1 x10E3/uL  TSH  Result Value Ref Range   TSH 1.820 0.450 - 4.500 uIU/mL  Urinalysis, Routine w reflex microscopic  Result Value Ref Range   Specific Gravity, UA <1.005 (L) 1.005 - 1.030   pH, UA 6.0 5.0 - 7.5   Color, UA Yellow Yellow   Appearance Ur Clear Clear   Leukocytes, UA Trace (A) Negative   Protein, UA Negative Negative/Trace   Glucose, UA Negative Negative   Ketones, UA Negative Negative   RBC, UA Trace (A) Negative   Bilirubin, UA Negative Negative   Urobilinogen, Ur 0.2 0.2 - 1.0 mg/dL   Nitrite, UA Negative Negative   Microscopic Examination See below:   PSA  Result Value Ref Range   Prostate Specific Ag, Serum 2.5 0.0 - 4.0 ng/mL      Assessment & Plan:   Problem List Items Addressed This Visit      Cardiovascular and Mediastinum   Essential hypertension - Primary    Poor control and interested in changing medications. Will change to tribenzor 40-10-25 And recheck 1 month or so with BMP to assess blood pressure      Relevant Medications   Olmesartan-Amlodipine-HCTZ (TRIBENZOR) 40-10-25 MG TABS   Other Relevant Orders   Basic metabolic panel     Other   Hematuria    For hematuria which isn't showing up today will refer to urology to further evaluate.      Relevant Orders   Urinalysis, Routine w reflex microscopic   Ambulatory referral to Urology    Other Visit Diagnoses    Needs flu shot       Relevant Orders   Flu Vaccine QUAD 36+ mos IM (Completed)       Follow up plan: Return in about 4 weeks (around 02/15/2017), or if symptoms worsen or fail to improve, for BMP.

## 2017-01-19 ENCOUNTER — Telehealth: Payer: Self-pay | Admitting: Family Medicine

## 2017-01-19 ENCOUNTER — Encounter: Payer: Self-pay | Admitting: Family Medicine

## 2017-01-19 LAB — BASIC METABOLIC PANEL
BUN/Creatinine Ratio: 11 (ref 9–20)
BUN: 13 mg/dL (ref 6–24)
CALCIUM: 9.8 mg/dL (ref 8.7–10.2)
CO2: 24 mmol/L (ref 20–29)
CREATININE: 1.18 mg/dL (ref 0.76–1.27)
Chloride: 105 mmol/L (ref 96–106)
GFR calc Af Amer: 79 mL/min/{1.73_m2} (ref >59)
GFR calc non Af Amer: 68 mL/min/{1.73_m2} (ref >59)
Glucose: 114 mg/dL — ABNORMAL HIGH (ref 65–99)
POTASSIUM: 4.7 mmol/L (ref 3.5–5.2)
Sodium: 141 mmol/L (ref 134–144)

## 2017-01-19 MED ORDER — LOSARTAN POTASSIUM 100 MG PO TABS: 100.0000 mg | ORAL_TABLET | Freq: Every day | ORAL | 3 refills | Status: DC

## 2017-01-19 MED ORDER — AMLODIPINE BESYLATE 10 MG PO TABS: 10.0000 mg | ORAL_TABLET | Freq: Every day | ORAL | 5 refills | Status: DC

## 2017-01-19 MED ORDER — HYDROCHLOROTHIAZIDE 25 MG PO TABS: 25.0000 mg | ORAL_TABLET | Freq: Every day | ORAL | 3 refills | Status: DC

## 2017-01-19 NOTE — Telephone Encounter (Signed)
Patient was transferred to provider for telephone conversation.   

## 2017-01-19 NOTE — Telephone Encounter (Signed)
Pt. Has a direct question for Dr .Jeananne Rama.Thanks.

## 2017-01-19 NOTE — Telephone Encounter (Signed)
Copied from [place] [phone]. Topic: Quick Communication - See Telephone Encounter >> Jan 19, 2017  8:39 AM Robina Ade, Helene Kelp D wrote: CRM for notification. See Telephone encounter for: 01/19/17. Patient called and said that the medication Olmesartan-Amlodipine-HCTZ (TRIBENZOR) 40-10-25 MG TABS was too much for him out of pocket and wants to know if Dr. Jeananne Rama can send in his old medication amlodipine,carvedilol and losartan to [facility] in [place].

## 2017-01-20 ENCOUNTER — Telehealth: Payer: Self-pay | Admitting: Family Medicine

## 2017-01-20 NOTE — Telephone Encounter (Signed)
Copied from [place] #[id number]. Topic: Quick Communication - See Telephone Encounter >> Jan 20, 2017 11:13 AM Ahmed Prima L wrote: CRM for notification. See Telephone encounter for:  Patient wants to know if the results from his urine test will be sent to Dr Natasha Mead when he goes next week to see her. Please give him a call. thanks 01/20/17.

## 2017-01-20 NOTE — Telephone Encounter (Signed)
That is a [facility] provider, they can see the results.

## 2017-01-26 NOTE — Progress Notes (Signed)
01/27/2017 12:28 PM   [name] 1960/12/17 [id number]  Referring provider: Guadalupe Maple, MD [address] [place],  [address]  Chief Complaint  Patient presents with  . Hematuria    HPI: Patient is a 56 -year-old Caucasian male who presents today as a referral from Dr. Golden Pop for gross hematuria.    Patient had gross hematuria three to four weeks ago after lifting something heavy.  It lasted for 10 hours.  It also happened 10 months ago and lasted for 10 hours.  He states he has had some foul smelling urine associated with the hematuria.     He has a remote history of nephrolithiasis which he passed spontaneously.   He does not have a prior history of recurrent urinary tract infections,  trauma to the genitourinary tract, BPH or malignancies of the genitourinary tract.   He does not have a family medical history of nephrolithiasis, malignancies of the genitourinary tract or hematuria.   Today, he symptoms of frequent urination, urgency, nocturia x 3 to 4, urge incontinence, intermittency and a weak urinary stream.  His UA today demonstrates 11/30 RBC's.  He is not experiencing any suprapubic pain, abdominal pain or flank pain. He denies any recent fevers, chills, nausea or vomiting.   He has not had any recent imaging studies.   He is not a smoker.  He is not exposed to secondhand smoke.  They have/have not worked with Sports administrator, trichloroethylene, etc.   He has HTN.  He has a high BMI.     PMH: Past Medical History:  Diagnosis Date  . Hyperlipidemia   . Kidney stone     Surgical History: Past Surgical History:  Procedure Laterality Date  . [place]  2012  . SEPTOPLASTY    . VASECTOMY Bilateral 1998    Home Medications:  Allergies as of 01/27/2017   No Known Allergies     Medication List        Accurate as of 01/27/17 12:28 PM. Always use your most recent med list.          amLODipine 10 MG tablet Commonly  known as:  NORVASC Take 1 tablet (10 mg total) by mouth daily.   aspirin EC 81 MG tablet Take 81 mg by mouth daily.   hydrochlorothiazide 25 MG tablet Commonly known as:  HYDRODIURIL Take 1 tablet (25 mg total) by mouth daily.   losartan 100 MG tablet Commonly known as:  COZAAR Take 1 tablet (100 mg total) by mouth daily.   valACYclovir 1000 MG tablet Commonly known as:  VALTREX Take 1 tablet (1,000 mg total) by mouth daily.       Allergies: No Known Allergies  Family History: Family History  Problem Relation Age of Onset  . Diabetes Mother   . Hypertension Mother   . Hematuria Neg Hx   . Kidney cancer Neg Hx   . Kidney disease Neg Hx   . Prostate cancer Neg Hx   . Sickle cell trait Neg Hx   . Tuberculosis Neg Hx     Social History:  reports that  has never smoked. he has never used smokeless tobacco. He reports that he does not drink alcohol or use drugs.  ROS: UROLOGY Frequent Urination?: Yes Hard to postpone urination?: Yes Burning/pain with urination?: No Get up at night to urinate?: Yes Leakage of urine?: Yes Urine stream starts and stops?: Yes Trouble starting stream?: No Do you have to strain to urinate?:  No Blood in urine?: Yes Urinary tract infection?: No Sexually transmitted disease?: Yes Injury to kidneys or bladder?: No Painful intercourse?: No Weak stream?: No Erection problems?: Yes Penile pain?: No  Gastrointestinal Nausea?: No Vomiting?: No Indigestion/heartburn?: No Diarrhea?: No Constipation?: No  Constitutional Fever: No Night sweats?: No Weight loss?: No Fatigue?: No  Skin Skin rash/lesions?: No Itching?: No  Eyes Blurred vision?: No Double vision?: No  Ears/Nose/Throat Sore throat?: No Sinus problems?: No  Hematologic/Lymphatic Swollen glands?: No Easy bruising?: No  Cardiovascular Leg swelling?: No Chest pain?: No  Respiratory Cough?: No Shortness of breath?: No  Endocrine Excessive thirst?:  No  Musculoskeletal Back pain?: No Joint pain?: No  Neurological Headaches?: No Dizziness?: No  Psychologic Depression?: No Anxiety?: No  Physical Exam: BP (!) 155/85   Pulse 61   Ht 5\' 10"  (1.778 m)   Wt 241 lb 9.6 oz (109.6 kg)   BMI 34.67 kg/m   Constitutional: Well nourished. Alert and oriented, No acute distress. HEENT: [place] AT, moist mucus membranes. Trachea midline, no masses. Cardiovascular: No clubbing, cyanosis, or edema. Respiratory: Normal respiratory effort, no increased work of breathing. GI: Abdomen is soft, non tender, non distended, no abdominal masses. Liver and spleen not palpable.  No hernias appreciated.  Stool sample for occult testing is not indicated.   GU: No CVA tenderness.  No bladder fullness or masses.  Patient with circumcised phallus.  Urethral meatus is patent.  No penile discharge. No penile lesions or rashes. Scrotum without lesions, cysts, rashes and/or edema.  Testicles are located scrotally bilaterally. No masses are appreciated in the testicles. Left and right epididymis are normal. Rectal: Patient with  normal sphincter tone. Anus and perineum without scarring or rashes. No rectal masses are appreciated. Prostate is approximately 60 grams, no nodules are appreciated. Seminal vesicles are normal. Skin: No rashes, bruises or suspicious lesions. Lymph: No cervical or inguinal adenopathy. Neurologic: Grossly intact, no focal deficits, moving all 4 extremities. Psychiatric: Normal mood and affect.  Laboratory Data: PSA History  2.5 ng/mL on 02/12/2016  Lab Results  Component Value Date   WBC 5.9 02/12/2016   HGB 14.9 02/12/2016   HCT 43.0 02/12/2016   MCV 88 02/12/2016   PLT 199 02/12/2016    Lab Results  Component Value Date   CREATININE 1.18 01/18/2017    Lab Results  Component Value Date   TSH 1.820 02/12/2016       Component Value Date/Time   CHOL 166 02/12/2016 1353   HDL 38 (L) 02/12/2016 1353   CHOLHDL 4.4 02/12/2016  1353   LDLCALC 85 02/12/2016 1353    Lab Results  Component Value Date   AST 22 02/12/2016   Lab Results  Component Value Date   ALT 33 02/12/2016    Urinalysis 11-30 RBC's.  See Epic.     Assessment & Plan:    1. Gross hematuria  - I explained to the patient that there are a number of causes that can be associated with blood in the urine, such as stones, BPH, UTI's, damage to the urinary tract and/or cancer.  - At this time, I felt that the patient warranted further urologic evaluation.   The AUA guidelines state that a CT urogram is the preferred imaging study to evaluate hematuria.  - I explained to the patient that a contrast material will be injected into a vein and that in rare instances, an allergic reaction can result and may even life threatening   The patient denies any  allergies to contrast, iodine and/or seafood and is not taking metformin.  - Following the imaging study,  I've recommended a cystoscopy. I described how this is performed, typically in an office setting with a flexible cystoscope. We described the risks, benefits, and possible side effects, the most common of which is a minor amount of blood in the urine and/or burning which usually resolves in 24 to 48 hours.    - The patient had the opportunity to ask questions which were answered. Based upon this discussion, the patient is willing to proceed. Therefore, I've ordered: a CT Urogram and cystoscopy.  - The patient will return following all of the above for discussion of the results.   - UA  - Urine culture  - BUN + creatinine    2. BPH with LU TS  -will address later if no worrisome findings are discovered during hematuria workup     Return for CT Urogram report and cystoscopy.  These notes generated with voice recognition software. I apologize for typographical errors.  Zara Council, [facility] [address] [place],  [address] [phone]

## 2017-01-27 ENCOUNTER — Ambulatory Visit (INDEPENDENT_AMBULATORY_CARE_PROVIDER_SITE_OTHER): Payer: BLUE CROSS/BLUE SHIELD | Admitting: Urology

## 2017-01-27 ENCOUNTER — Encounter: Payer: Self-pay | Admitting: Urology

## 2017-01-27 VITALS — BP 155/85 | HR 61 | Ht 70.0 in | Wt 241.6 lb

## 2017-01-27 DIAGNOSIS — R310 Gross hematuria: Secondary | ICD-10-CM

## 2017-01-27 DIAGNOSIS — N401 Enlarged prostate with lower urinary tract symptoms: Secondary | ICD-10-CM | POA: Diagnosis not present

## 2017-01-27 DIAGNOSIS — N138 Other obstructive and reflux uropathy: Secondary | ICD-10-CM

## 2017-01-27 LAB — URINALYSIS, COMPLETE
BILIRUBIN UA: NEGATIVE
Glucose, UA: NEGATIVE
KETONES UA: NEGATIVE
NITRITE UA: NEGATIVE
Protein, UA: NEGATIVE
SPEC GRAV UA: 1.01 (ref 1.005–1.030)
Urobilinogen, Ur: 0.2 mg/dL (ref 0.2–1.0)
pH, UA: 6 (ref 5.0–7.5)

## 2017-01-27 LAB — MICROSCOPIC EXAMINATION
Bacteria, UA: NONE SEEN
EPITHELIAL CELLS (NON RENAL): NONE SEEN /HPF (ref 0–10)
WBC, UA: NONE SEEN /hpf (ref 0–?)

## 2017-01-27 NOTE — Patient Instructions (Addendum)
Hematuria, Adult Hematuria is blood in your urine. It can be caused by a bladder infection, kidney infection, prostate infection, kidney stone, or cancer of your urinary tract. Infections can usually be treated with medicine, and a kidney stone usually will pass through your urine. If neither of these is the cause of your hematuria, further workup to find out the reason may be needed. It is very important that you tell your health care provider about any blood you see in your urine, even if the blood stops without treatment or happens without causing pain. Blood in your urine that happens and then stops and then happens again can be a symptom of a very serious condition. Also, pain is not a symptom in the initial stages of many urinary cancers. Follow these instructions at home:  Drink lots of fluid, 3-4 quarts a day. If you have been diagnosed with an infection, cranberry juice is especially recommended, in addition to large amounts of water.  Avoid caffeine, tea, and carbonated beverages because they tend to irritate the bladder.  Avoid alcohol because it may irritate the prostate.  Take all medicines as directed by your health care provider.  If you were prescribed an antibiotic medicine, finish it all even if you start to feel better.  If you have been diagnosed with a kidney stone, follow your health care provider's instructions regarding straining your urine to catch the stone.  Empty your bladder often. Avoid holding urine for long periods of time.  After a bowel movement, women should cleanse front to back. Use each tissue only once.  Empty your bladder before and after sexual intercourse if you are a male. Contact a health care provider if:  You develop back pain.  You have a fever.  You have a feeling of sickness in your stomach (nausea) or vomiting.  Your symptoms are not better in 3 days. Return sooner if you are getting worse. Get help right away if:  You develop  severe vomiting and are unable to keep the medicine down.  You develop severe back or abdominal pain despite taking your medicines.  You begin passing a large amount of blood or clots in your urine.  You feel extremely weak or faint, or you pass out. This information is not intended to replace advice given to you by your health care provider. Make sure you discuss any questions you have with your health care provider. Document Released: 02/16/2005 Document Revised: 07/25/2015 Document Reviewed: 10/17/2012 Elsevier Interactive Patient Education  2017 Elsevier Inc.  CT Scan A computed tomography (CT) scan is a specialized X-ray scan. It uses X-rays and a computer to make pictures of different areas of your body. A CT scan can offer more detailed information than a regular X-ray exam. The CT scan provides data about internal organs, soft tissue structures, blood vessels, and bones. The CT scanner is a large machine that takes pictures of your body as you move through the opening. Tell a health care provider about:  Any allergies you have.  All medicines you are taking, including vitamins, herbs, eye drops, creams, and over-the-counter medicines.  Any problems you or family members have had with anesthetic medicines.  Any blood disorders you have.  Any surgeries you have had.  Any medical conditions you have. What are the risks? Generally, this is a safe procedure. However, as with any procedure, problems can occur. Possible problems include:  An allergic reaction to the contrast material.  Development of cancer from excessive exposure to   radiation. The risk of this is small.  What happens before the procedure?  The day before the test, stop drinking caffeinated beverages. These include energy drinks, tea, soda, coffee, and hot chocolate.  On the day of the test: ? About 4 hours before the test, stop eating and drinking anything but water as advised by your health care  provider. ? Avoid wearing jewelry. You will have to partly or fully undress and wear a hospital gown. What happens during the procedure?  You will be asked to lie on a table with your arms above your head.  If contrast dye is to be used for the test, an IV tube will be inserted in your arm. The contrast dye will be injected into the IV tube. You might feel warm, or you may get a metallic taste in your mouth.  The table you will be lying on will move into a large machine that will do the scanning.  You will be able to see, hear, and talk to the person running the machine while you are in it. Follow that person's directions.  The CT machine will move around you to take pictures. Do not move while it is scanning. This helps to get a good image.  When the best possible pictures have been taken, the machine will be turned off. The table will be moved out of the machine. The IV tube will then be removed. What happens after the procedure? Ask your health care provider when to follow up for your test results. This information is not intended to replace advice given to you by your health care provider. Make sure you discuss any questions you have with your health care provider. Document Released: 03/26/2004 Document Revised: 07/25/2015 Document Reviewed: 10/24/2012 Elsevier Interactive Patient Education  2017 Elsevier Inc.  Cystoscopy Cystoscopy is a procedure that is used to help diagnose and sometimes treat conditions that affect that lower urinary tract. The lower urinary tract includes the bladder and the tube that drains urine from the bladder out of the body (urethra). Cystoscopy is performed with a thin, tube-shaped instrument with a light and camera at the end (cystoscope). The cystoscope may be hard (rigid) or flexible, depending on the goal of the procedure.The cystoscope is inserted through the urethra, into the bladder. Cystoscopy may be recommended if you have:  Urinary tractinfections  that keep coming back (recurring).  Blood in the urine (hematuria).  Loss of bladder control (urinary incontinence) or an overactive bladder.  Unusual cells found in a urine sample.  A blockage in the urethra.  Painful urination.  An abnormality in the bladder found during an intravenous pyelogram (IVP) or CT scan.  Cystoscopy may also be done to remove a sample of tissue to be examined under a microscope (biopsy). Tell a health care provider about:  Any allergies you have.  All medicines you are taking, including vitamins, herbs, eye drops, creams, and over-the-counter medicines.  Any problems you or family members have had with anesthetic medicines.  Any blood disorders you have.  Any surgeries you have had.  Any medical conditions you have.  Whether you are pregnant or may be pregnant. What are the risks? Generally, this is a safe procedure. However, problems may occur, including:  Infection.  Bleeding.  Allergic reactions to medicines.  Damage to other structures or organs.  What happens before the procedure?  Ask your health care provider about: ? Changing or stopping your regular medicines. This is especially important if you are taking   diabetes medicines or blood thinners. ? Taking medicines such as aspirin and ibuprofen. These medicines can thin your blood. Do not take these medicines before your procedure if your health care provider instructs you not to.  Follow instructions from your health care provider about eating or drinking restrictions.  You may be given antibiotic medicine to help prevent infection.  You may have an exam or testing, such as X-rays of the bladder, urethra, or kidneys.  You may have urine tests to check for signs of infection.  Plan to have someone take you home after the procedure. What happens during the procedure?  To reduce your risk of infection,your health care team will wash or sanitize their hands.  You will be  given one or more of the following: ? A medicine to help you relax (sedative). ? A medicine to numb the area (local anesthetic).  The area around the opening of your urethra will be cleaned.  The cystoscope will be passed through your urethra into your bladder.  Germ-free (sterile)fluid will flow through the cystoscope to fill your bladder. The fluid will stretch your bladder so that your surgeon can clearly examine your bladder walls.  The cystoscope will be removed and your bladder will be emptied. The procedure may vary among health care providers and hospitals. What happens after the procedure?  You may have some soreness or pain in your abdomen and urethra. Medicines will be available to help you.  You may have some blood in your urine.  Do not drive for 24 hours if you received a sedative. This information is not intended to replace advice given to you by your health care provider. Make sure you discuss any questions you have with your health care provider. Document Released: 02/14/2000 Document Revised: 06/27/2015 Document Reviewed: 01/03/2015 Elsevier Interactive Patient Education  2017 Elsevier Inc.  

## 2017-01-28 LAB — BUN+CREAT
BUN / CREAT RATIO: 15 (ref 9–20)
BUN: 16 mg/dL (ref 6–24)
Creatinine, Ser: 1.1 mg/dL (ref 0.76–1.27)
GFR calc Af Amer: 86 mL/min/{1.73_m2} (ref >59)
GFR calc non Af Amer: 74 mL/min/{1.73_m2} (ref >59)

## 2017-02-01 LAB — CULTURE, URINE COMPREHENSIVE

## 2017-02-24 ENCOUNTER — Other Ambulatory Visit: Payer: Self-pay | Admitting: Family Medicine

## 2017-02-24 ENCOUNTER — Other Ambulatory Visit: Payer: BLUE CROSS/BLUE SHIELD | Admitting: Urology

## 2017-02-24 DIAGNOSIS — I10 Essential (primary) hypertension: Secondary | ICD-10-CM

## 2017-02-24 NOTE — Telephone Encounter (Signed)
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   Looks like his therapy was changed with OV in Nov. 2018.

## 2017-03-10 ENCOUNTER — Other Ambulatory Visit: Payer: BLUE CROSS/BLUE SHIELD | Admitting: Urology

## 2017-03-18 ENCOUNTER — Other Ambulatory Visit: Payer: Self-pay | Admitting: Family Medicine

## 2017-03-18 DIAGNOSIS — I10 Essential (primary) hypertension: Secondary | ICD-10-CM

## 2017-03-18 NOTE — Telephone Encounter (Signed)
Medication refill request.

## 2017-04-05 ENCOUNTER — Ambulatory Visit
Admission: RE | Admit: 2017-04-05 | Discharge: 2017-04-05 | Disposition: A | Discharge status: Home / Self Care | Payer: BLUE CROSS/BLUE SHIELD | Source: Ambulatory Visit | Attending: Urology | Admitting: Urology

## 2017-04-05 DIAGNOSIS — K429 Umbilical hernia without obstruction or gangrene: Secondary | ICD-10-CM | POA: Insufficient documentation

## 2017-04-05 DIAGNOSIS — N200 Calculus of kidney: Secondary | ICD-10-CM | POA: Insufficient documentation

## 2017-04-05 DIAGNOSIS — R310 Gross hematuria: Secondary | ICD-10-CM

## 2017-04-05 DIAGNOSIS — N210 Calculus in bladder: Secondary | ICD-10-CM | POA: Insufficient documentation

## 2017-04-05 DIAGNOSIS — N281 Cyst of kidney, acquired: Secondary | ICD-10-CM | POA: Diagnosis not present

## 2017-04-05 DIAGNOSIS — K5750 Diverticulosis of both small and large intestine without perforation or abscess without bleeding: Secondary | ICD-10-CM | POA: Insufficient documentation

## 2017-04-05 DIAGNOSIS — N400 Enlarged prostate without lower urinary tract symptoms: Secondary | ICD-10-CM | POA: Insufficient documentation

## 2017-04-05 MED ORDER — IOPAMIDOL (ISOVUE-300) INJECTION 61%
125.0000 mL | Freq: Once | INTRAVENOUS | Status: AC | PRN
  Administered 2017-04-05: 125 mL via INTRAVENOUS

## 2017-04-07 ENCOUNTER — Ambulatory Visit (INDEPENDENT_AMBULATORY_CARE_PROVIDER_SITE_OTHER): Payer: BLUE CROSS/BLUE SHIELD | Admitting: Urology

## 2017-04-07 ENCOUNTER — Encounter: Payer: Self-pay | Admitting: Urology

## 2017-04-07 VITALS — BP 120/75 | HR 78 | Ht 70.0 in | Wt 230.0 lb

## 2017-04-07 DIAGNOSIS — N200 Calculus of kidney: Secondary | ICD-10-CM

## 2017-04-07 DIAGNOSIS — N401 Enlarged prostate with lower urinary tract symptoms: Secondary | ICD-10-CM

## 2017-04-07 DIAGNOSIS — R310 Gross hematuria: Secondary | ICD-10-CM

## 2017-04-07 DIAGNOSIS — N138 Other obstructive and reflux uropathy: Secondary | ICD-10-CM

## 2017-04-07 DIAGNOSIS — N210 Calculus in bladder: Secondary | ICD-10-CM

## 2017-04-07 DIAGNOSIS — N281 Cyst of kidney, acquired: Secondary | ICD-10-CM

## 2017-04-07 LAB — MICROSCOPIC EXAMINATION
Bacteria, UA: NONE SEEN
EPITHELIAL CELLS (NON RENAL): NONE SEEN /HPF (ref 0–10)
WBC UA: NONE SEEN /HPF (ref 0–?)

## 2017-04-07 LAB — URINALYSIS, COMPLETE
BILIRUBIN UA: NEGATIVE
GLUCOSE, UA: NEGATIVE
KETONES UA: NEGATIVE
Nitrite, UA: NEGATIVE
PH UA: 6.5 (ref 5.0–7.5)
PROTEIN UA: NEGATIVE
Specific Gravity, UA: 1.015 (ref 1.005–1.030)
UUROB: 0.2 mg/dL (ref 0.2–1.0)

## 2017-04-07 MED ORDER — LIDOCAINE HCL 2 % EX GEL
1.0000 "application " | Freq: Once | CUTANEOUS | Status: AC
  Administered 2017-04-07: 1 via URETHRAL

## 2017-04-07 MED ORDER — CIPROFLOXACIN HCL 500 MG PO TABS
500.0000 mg | ORAL_TABLET | Freq: Once | ORAL | Status: AC
  Administered 2017-04-07: 500 mg via ORAL

## 2017-04-08 NOTE — Progress Notes (Signed)
   04/08/17  CC:  Chief Complaint  Patient presents with  . Cysto    HPI: 56 year old male who saw Zara Council in late November after an episode of total gross painless hematuria which has happened previously.  He denies recurrent hematuria.  See office note 01/27/2017.  CT urogram: 3.1 cm simple cyst left upper pole; bilateral, nonobstructing renal calculi; 4 bladder calculi the largest measuring 17 mm.  Prostate enlargement with a calculated volume of 87 cc.  Blood pressure 120/75, pulse 78, height 5\' 10"  (1.778 m), weight 230 lb (104.3 kg). NED. A&Ox3.   No respiratory distress   Abd soft, NT, ND Normal phallus with bilateral descended testicles  Cystoscopy Procedure Note  Patient identification was confirmed, informed consent was obtained, and patient was prepped using Betadine solution.  Lidocaine jelly was administered per urethral meatus.    Preoperative abx where received prior to procedure.     Pre-Procedure: - Inspection reveals a normal caliber ureteral meatus.  Procedure: The flexible cystoscope was introduced without difficulty - No urethral strictures/lesions are present. - Prostate coapting lateral lobes with a large median lobe - Elevated bladder neck - Bilateral ureteral orifices identified - Bladder mucosa  reveals no ulcers, tumors, or lesions - 4 bladder calculi present all greater than 1 cm - No trabeculation  Retroflexion shows bladder calculi.  No significant intravesical median lobe   Post-Procedure: - Patient tolerated the procedure well  Assessment/ Plan: Significant prostate enlargement with for bladder calculi measuring greater than 1 cm.  Findings were discussed in detail with James Rush.  He was informed without treatment his bladder calculi will most likely enlarge and become more difficult to treat.  Would also recommend an outlet procedure.  We discussed cystolitholapaxy, open surgery, TURP, HoLEP and minimally invasive options  for prostate enlargement including water vapor ablation and UroLift.  He has elected to proceed with TURP and cystolitholapaxy.  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  The procedure was discussed in detail including potential risks of bleeding, infection, persistent voiding symptoms, bladder neck contracture, urethral stricture, urinary incontinence.  The common side effect of retrograde ejaculation was also discussed.  He indicated all questions were answered and desires to proceed.

## 2017-04-11 DIAGNOSIS — N138 Other obstructive and reflux uropathy: Secondary | ICD-10-CM | POA: Insufficient documentation

## 2017-04-11 DIAGNOSIS — N401 Enlarged prostate with lower urinary tract symptoms: Secondary | ICD-10-CM

## 2017-04-11 DIAGNOSIS — N200 Calculus of kidney: Secondary | ICD-10-CM | POA: Insufficient documentation

## 2017-04-11 DIAGNOSIS — N210 Calculus in bladder: Secondary | ICD-10-CM | POA: Insufficient documentation

## 2017-04-11 DIAGNOSIS — N281 Cyst of kidney, acquired: Secondary | ICD-10-CM | POA: Insufficient documentation

## 2017-04-26 ENCOUNTER — Telehealth: Payer: Self-pay | Admitting: Radiology

## 2017-04-26 NOTE — Telephone Encounter (Signed)
Pt is scheduled for TURP & cystolitholapaxy on 05/28/2017 per pt request. -Need orders -What would you like for f/u post op?

## 2017-04-27 ENCOUNTER — Other Ambulatory Visit: Payer: Self-pay | Admitting: Radiology

## 2017-04-27 DIAGNOSIS — N210 Calculus in bladder: Secondary | ICD-10-CM

## 2017-04-27 DIAGNOSIS — N138 Other obstructive and reflux uropathy: Secondary | ICD-10-CM

## 2017-04-27 DIAGNOSIS — N401 Enlarged prostate with lower urinary tract symptoms: Principal | ICD-10-CM

## 2017-04-27 NOTE — Telephone Encounter (Signed)
Pt notified of pre-admit testing appt & follow up appts. Instructions given regarding surgery. Questions answered. Pt voices understanding.

## 2017-05-16 ENCOUNTER — Other Ambulatory Visit: Payer: Self-pay | Admitting: Family Medicine

## 2017-05-17 NOTE — Telephone Encounter (Signed)
HCTZ refill Last OV: 01/19/17 Last Refill:01/19/17 Pharmacy:[facility]. [place] PCP: Golden Pop MD  Losartan Potassium refill Last OV: 01/19/17 Last Refill:01/19/17 [facility]. [place] PCP: Golden Pop MD

## 2017-05-24 ENCOUNTER — Encounter
Admission: RE | Admit: 2017-05-24 | Discharge: 2017-05-24 | Disposition: A | Discharge status: Home / Self Care | Payer: BLUE CROSS/BLUE SHIELD | Source: Ambulatory Visit | Attending: Urology | Admitting: Urology

## 2017-05-24 ENCOUNTER — Other Ambulatory Visit: Payer: BLUE CROSS/BLUE SHIELD

## 2017-05-24 ENCOUNTER — Other Ambulatory Visit: Payer: Self-pay

## 2017-05-24 DIAGNOSIS — Z01812 Encounter for preprocedural laboratory examination: Secondary | ICD-10-CM | POA: Diagnosis present

## 2017-05-24 DIAGNOSIS — Z01810 Encounter for preprocedural cardiovascular examination: Secondary | ICD-10-CM | POA: Diagnosis not present

## 2017-05-24 DIAGNOSIS — I10 Essential (primary) hypertension: Secondary | ICD-10-CM | POA: Diagnosis not present

## 2017-05-24 HISTORY — DX: Calculus in bladder: N21.0

## 2017-05-24 HISTORY — DX: Herpesviral infection of urogenital system, unspecified: A60.00

## 2017-05-24 HISTORY — DX: Personal history of urinary calculi: Z87.442

## 2017-05-24 LAB — CBC
HCT: 45.9 % (ref 40.0–52.0)
Hemoglobin: 15.6 g/dL (ref 13.0–18.0)
MCH: 30.5 pg (ref 26.0–34.0)
MCHC: 34 g/dL (ref 32.0–36.0)
MCV: 89.7 fL (ref 80.0–100.0)
Platelets: 195 10*3/uL (ref 150–440)
RBC: 5.11 MIL/uL (ref 4.40–5.90)
RDW: 12.9 % (ref 11.5–14.5)
WBC: 8.1 10*3/uL (ref 3.8–10.6)

## 2017-05-24 NOTE — Patient Instructions (Signed)
Your procedure is scheduled on: Friday, May 28, 2017 Report to Day Surgery on the 2nd floor of the [facility]. To find out your arrival time, please call [phone] between 1PM - 3PM on: Thursday, May 27, 2017  REMEMBER: Instructions that are not followed completely may result in serious medical risk, up to and including death; or upon the discretion of your surgeon and anesthesiologist your surgery may need to be rescheduled.  Do not eat food after midnight the night before your procedure.  No gum chewing, lozengers or hard candies.  You may however, drink CLEAR liquids up to 2 hours before you are scheduled to arrive for your surgery. Do not drink anything within 2 hours of the start of your surgery.  Clear liquids include: - water  - apple juice without pulp - clear gatorade - black coffee or tea (Do NOT add anything to the coffee or tea) Do NOT drink anything that is not on this list.  No Alcohol for 24 hours before or after surgery.  No Smoking including e-cigarettes for 24 hours prior to surgery.  No chewable tobacco products for at least 6 hours prior to surgery.  No nicotine patches on the day of surgery.  On the morning of surgery brush your teeth with toothpaste and water, you may rinse your mouth with mouthwash if you wish. Do not swallow any toothpaste or mouthwash.  Notify your doctor if there is any change in your medical condition (cold, fever, infection).  Do not wear jewelry, make-up, hairpins, clips or nail polish.  Do not wear lotions, powders, or perfumes. You may wear deodorant.  Do not shave 48 hours prior to surgery. Men may shave face and neck.  Contacts and dentures may not be worn into surgery.  Do not bring valuables to the hospital, including drivers license, insurance or credit cards.  [facility] is not responsible for any belongings or valuables.   TAKE THESE MEDICATIONS THE MORNING OF SURGERY:  1.  AMLODIPINE  NOW!  Stop  Anti-inflammatories (NSAIDS) such as Advil, Aleve, Ibuprofen, Motrin, Naproxen, Naprosyn and Aspirin based products such as Excedrin, Goodys Powder, BC Powder. (May take Tylenol or Acetaminophen if needed.)  NOW!  Stop ANY OVER THE COUNTER supplements until after surgery.  Wear comfortable clothing (specific to your surgery type) to the hospital.  Plan for stool softeners for home use.  If you are being admitted to the hospital overnight, leave your suitcase in the car. After surgery it may be brought to your room.  If you are being discharged the day of surgery, you will not be allowed to drive home. You will need a responsible adult to drive you home and stay with you that night.   If you are taking public transportation, you will need to have a responsible adult with you. Please confirm with your physician that it is acceptable to use public transportation.   Please call [phone] if you have any questions about these instructions.

## 2017-05-25 LAB — URINE CULTURE: Culture: NO GROWTH

## 2017-05-27 MED ORDER — CEFAZOLIN SODIUM-DEXTROSE 2-4 GM/100ML-% IV SOLN
2.0000 g | INTRAVENOUS | Status: AC
  Administered 2017-05-28: 2 g via INTRAVENOUS

## 2017-05-28 ENCOUNTER — Ambulatory Visit: Payer: BLUE CROSS/BLUE SHIELD | Admitting: Certified Registered"

## 2017-05-28 ENCOUNTER — Other Ambulatory Visit: Payer: Self-pay

## 2017-05-28 ENCOUNTER — Encounter
Admission: RE | Disposition: A | Discharge status: Home / Self Care | Payer: Self-pay | Source: Ambulatory Visit | Attending: Urology

## 2017-05-28 ENCOUNTER — Observation Stay
Admission: RE | Admit: 2017-05-28 | Discharge: 2017-05-29 | Disposition: A | Discharge status: Home / Self Care | Payer: BLUE CROSS/BLUE SHIELD | Source: Ambulatory Visit | Attending: Urology | Admitting: Urology

## 2017-05-28 DIAGNOSIS — N401 Enlarged prostate with lower urinary tract symptoms: Principal | ICD-10-CM | POA: Insufficient documentation

## 2017-05-28 DIAGNOSIS — N320 Bladder-neck obstruction: Secondary | ICD-10-CM | POA: Insufficient documentation

## 2017-05-28 DIAGNOSIS — N210 Calculus in bladder: Secondary | ICD-10-CM

## 2017-05-28 DIAGNOSIS — A6000 Herpesviral infection of urogenital system, unspecified: Secondary | ICD-10-CM | POA: Insufficient documentation

## 2017-05-28 DIAGNOSIS — E785 Hyperlipidemia, unspecified: Secondary | ICD-10-CM | POA: Diagnosis not present

## 2017-05-28 DIAGNOSIS — Z9079 Acquired absence of other genital organ(s): Secondary | ICD-10-CM

## 2017-05-28 DIAGNOSIS — N4289 Other specified disorders of prostate: Secondary | ICD-10-CM | POA: Insufficient documentation

## 2017-05-28 DIAGNOSIS — N138 Other obstructive and reflux uropathy: Secondary | ICD-10-CM

## 2017-05-28 DIAGNOSIS — Z87442 Personal history of urinary calculi: Secondary | ICD-10-CM | POA: Diagnosis not present

## 2017-05-28 DIAGNOSIS — I10 Essential (primary) hypertension: Secondary | ICD-10-CM | POA: Insufficient documentation

## 2017-05-28 DIAGNOSIS — Z79899 Other long term (current) drug therapy: Secondary | ICD-10-CM | POA: Diagnosis not present

## 2017-05-28 DIAGNOSIS — N419 Inflammatory disease of prostate, unspecified: Secondary | ICD-10-CM | POA: Diagnosis not present

## 2017-05-28 HISTORY — PX: CYSTOSCOPY WITH LITHOLAPAXY: SHX1425

## 2017-05-28 HISTORY — PX: TRANSURETHRAL RESECTION OF PROSTATE: SHX73

## 2017-05-28 SURGERY — TURP (TRANSURETHRAL RESECTION OF PROSTATE)
Anesthesia: General | Wound class: Clean Contaminated

## 2017-05-28 MED ORDER — KETOROLAC TROMETHAMINE 30 MG/ML IJ SOLN
INTRAMUSCULAR | Status: AC
  Filled 2017-05-28: qty 1

## 2017-05-28 MED ORDER — ROCURONIUM BROMIDE 100 MG/10ML IV SOLN
INTRAVENOUS | Status: DC | PRN
  Administered 2017-05-28: 50 mg via INTRAVENOUS
  Administered 2017-05-28: 20 mg via INTRAVENOUS

## 2017-05-28 MED ORDER — AMLODIPINE BESYLATE 10 MG PO TABS
10.0000 mg | ORAL_TABLET | Freq: Every morning | ORAL | Status: DC
  Administered 2017-05-29: 10 mg via ORAL
  Filled 2017-05-28: qty 1

## 2017-05-28 MED ORDER — DEXAMETHASONE SODIUM PHOSPHATE 10 MG/ML IJ SOLN
INTRAMUSCULAR | Status: DC | PRN
  Administered 2017-05-28: 10 mg via INTRAVENOUS

## 2017-05-28 MED ORDER — ONDANSETRON HCL 4 MG/2ML IJ SOLN: 4.0000 mg | INTRAMUSCULAR | Status: DC | PRN

## 2017-05-28 MED ORDER — PROMETHAZINE HCL 25 MG/ML IJ SOLN: 6.2500 mg | INTRAMUSCULAR | Status: DC | PRN

## 2017-05-28 MED ORDER — ACETAMINOPHEN 10 MG/ML IV SOLN
INTRAVENOUS | Status: AC
Start: 2017-05-28 — End: 2017-05-28
  Filled 2017-05-28: qty 100

## 2017-05-28 MED ORDER — ROCURONIUM BROMIDE 50 MG/5ML IV SOLN
INTRAVENOUS | Status: AC
Start: 2017-05-28 — End: 2017-05-28
  Filled 2017-05-28: qty 2

## 2017-05-28 MED ORDER — SUGAMMADEX SODIUM 500 MG/5ML IV SOLN
INTRAVENOUS | Status: AC
  Filled 2017-05-28: qty 5

## 2017-05-28 MED ORDER — KETOROLAC TROMETHAMINE 30 MG/ML IJ SOLN
INTRAMUSCULAR | Status: DC | PRN
  Administered 2017-05-28: 30 mg via INTRAVENOUS

## 2017-05-28 MED ORDER — HYDROMORPHONE HCL 1 MG/ML IJ SOLN
INTRAMUSCULAR | Status: DC | PRN
  Administered 2017-05-28 (×2): 1 mg via INTRAVENOUS

## 2017-05-28 MED ORDER — MIDAZOLAM HCL 2 MG/2ML IJ SOLN
INTRAMUSCULAR | Status: DC | PRN
  Administered 2017-05-28 (×2): 2 mg via INTRAVENOUS

## 2017-05-28 MED ORDER — HYDROCODONE-ACETAMINOPHEN 5-325 MG PO TABS
1.0000 | ORAL_TABLET | ORAL | Status: DC | PRN
  Administered 2017-05-28: 1 via ORAL
  Filled 2017-05-28: qty 1

## 2017-05-28 MED ORDER — DEXAMETHASONE SODIUM PHOSPHATE 10 MG/ML IJ SOLN
INTRAMUSCULAR | Status: AC
  Filled 2017-05-28: qty 1

## 2017-05-28 MED ORDER — SENNOSIDES-DOCUSATE SODIUM 8.6-50 MG PO TABS
2.0000 | ORAL_TABLET | Freq: Every day | ORAL | Status: DC
  Administered 2017-05-28: 2 via ORAL
  Filled 2017-05-28: qty 2

## 2017-05-28 MED ORDER — GLYCOPYRROLATE 0.2 MG/ML IJ SOLN
INTRAMUSCULAR | Status: DC | PRN
  Administered 2017-05-28: 0.1 mg via INTRAVENOUS

## 2017-05-28 MED ORDER — LOSARTAN POTASSIUM 50 MG PO TABS
100.0000 mg | ORAL_TABLET | Freq: Every day | ORAL | Status: DC
  Administered 2017-05-28 – 2017-05-29 (×2): 100 mg via ORAL
  Filled 2017-05-28 (×2): qty 2

## 2017-05-28 MED ORDER — FAMOTIDINE 20 MG PO TABS
20.0000 mg | ORAL_TABLET | Freq: Once | ORAL | Status: AC
  Administered 2017-05-28: 20 mg via ORAL

## 2017-05-28 MED ORDER — LACTATED RINGERS IV SOLN
INTRAVENOUS | Status: DC
  Administered 2017-05-28: 22:00:00 via INTRAVENOUS

## 2017-05-28 MED ORDER — FAMOTIDINE 20 MG PO TABS
ORAL_TABLET | ORAL | Status: AC
  Administered 2017-05-28: 20 mg via ORAL
  Filled 2017-05-28: qty 1

## 2017-05-28 MED ORDER — CEFAZOLIN SODIUM-DEXTROSE 2-4 GM/100ML-% IV SOLN
INTRAVENOUS | Status: AC
  Filled 2017-05-28: qty 100

## 2017-05-28 MED ORDER — SULFAMETHOXAZOLE-TRIMETHOPRIM 800-160 MG PO TABS
1.0000 | ORAL_TABLET | Freq: Two times a day (BID) | ORAL | Status: DC
  Administered 2017-05-28 – 2017-05-29 (×3): 1 via ORAL
  Filled 2017-05-28 (×4): qty 1

## 2017-05-28 MED ORDER — ONDANSETRON HCL 4 MG/2ML IJ SOLN
INTRAMUSCULAR | Status: AC
  Filled 2017-05-28: qty 2

## 2017-05-28 MED ORDER — HYDROCHLOROTHIAZIDE 25 MG PO TABS
25.0000 mg | ORAL_TABLET | Freq: Every day | ORAL | Status: DC
  Administered 2017-05-28 – 2017-05-29 (×2): 25 mg via ORAL
  Filled 2017-05-28 (×2): qty 1

## 2017-05-28 MED ORDER — PROPOFOL 10 MG/ML IV BOLUS
INTRAVENOUS | Status: DC | PRN
  Administered 2017-05-28: 150 mg via INTRAVENOUS
  Administered 2017-05-28: 50 mg via INTRAVENOUS

## 2017-05-28 MED ORDER — BELLADONNA ALKALOIDS-OPIUM 16.2-60 MG RE SUPP: 1.0000 | Freq: Four times a day (QID) | RECTAL | Status: DC | PRN

## 2017-05-28 MED ORDER — MIDAZOLAM HCL 2 MG/2ML IJ SOLN
INTRAMUSCULAR | Status: AC
  Filled 2017-05-28: qty 4

## 2017-05-28 MED ORDER — PHENYLEPHRINE HCL 10 MG/ML IJ SOLN
INTRAMUSCULAR | Status: DC | PRN
  Administered 2017-05-28 (×2): 100 ug via INTRAVENOUS
  Administered 2017-05-28: 200 ug via INTRAVENOUS
  Administered 2017-05-28: 100 ug via INTRAVENOUS

## 2017-05-28 MED ORDER — PROPOFOL 10 MG/ML IV BOLUS
INTRAVENOUS | Status: AC
  Filled 2017-05-28: qty 20

## 2017-05-28 MED ORDER — LIDOCAINE HCL (PF) 2 % IJ SOLN
INTRAMUSCULAR | Status: AC
Start: 2017-05-28 — End: 2017-05-28
  Filled 2017-05-28: qty 10

## 2017-05-28 MED ORDER — HYDROMORPHONE HCL 1 MG/ML IJ SOLN
INTRAMUSCULAR | Status: AC
  Filled 2017-05-28: qty 2

## 2017-05-28 MED ORDER — FENTANYL CITRATE (PF) 100 MCG/2ML IJ SOLN: 25.0000 ug | INTRAMUSCULAR | Status: DC | PRN

## 2017-05-28 MED ORDER — ACETAMINOPHEN 10 MG/ML IV SOLN
INTRAVENOUS | Status: DC | PRN
  Administered 2017-05-28: 1000 mg via INTRAVENOUS

## 2017-05-28 MED ORDER — SUGAMMADEX SODIUM 200 MG/2ML IV SOLN
INTRAVENOUS | Status: DC | PRN
  Administered 2017-05-28: 250 mg via INTRAVENOUS

## 2017-05-28 MED ORDER — LIDOCAINE HCL (CARDIAC) 20 MG/ML IV SOLN
INTRAVENOUS | Status: DC | PRN
  Administered 2017-05-28: 50 mg via INTRAVENOUS

## 2017-05-28 MED ORDER — ONDANSETRON HCL 4 MG/2ML IJ SOLN
INTRAMUSCULAR | Status: DC | PRN
  Administered 2017-05-28: 4 mg via INTRAVENOUS

## 2017-05-28 MED ORDER — HYDROCODONE-ACETAMINOPHEN 5-325 MG PO TABS: 1.0000 | ORAL_TABLET | ORAL | 0 refills | Status: DC | PRN

## 2017-05-28 MED ORDER — PHENYLEPHRINE HCL 10 MG/ML IJ SOLN
INTRAMUSCULAR | Status: AC
Start: 2017-05-28 — End: 2017-05-28
  Filled 2017-05-28: qty 1

## 2017-05-28 MED ORDER — LACTATED RINGERS IV SOLN
INTRAVENOUS | Status: DC
  Administered 2017-05-28 (×2): via INTRAVENOUS

## 2017-05-28 MED ORDER — GLYCOPYRROLATE 0.2 MG/ML IJ SOLN
INTRAMUSCULAR | Status: AC
  Filled 2017-05-28: qty 1

## 2017-05-28 SURGICAL SUPPLY — 35 items
ADAPTER IRRIG TUBE 2 SPIKE SOL (ADAPTER) ×4 IMPLANT
BAG DRAIN CYSTO-URO LG1000N (MISCELLANEOUS) ×2 IMPLANT
BAG URO DRAIN 4000ML (MISCELLANEOUS) ×2 IMPLANT
BASKET ZERO TIP 1.9FR (BASKET) IMPLANT
BLADE SURG SZ12 CARB STEEL (BLADE) ×2 IMPLANT
CATH FOL 2WAY LX 24X30 (CATHETERS) IMPLANT
CATH FOLEY 3WAY 30CC 22FR (CATHETERS) ×2 IMPLANT
DRAPE UTILITY 15X26 TOWEL STRL (DRAPES) ×2 IMPLANT
DRSG TELFA 3X8 NADH (GAUZE/BANDAGES/DRESSINGS) ×2 IMPLANT
ELECT BIVAP BIPO 22/24 DONUT (ELECTROSURGICAL) ×2
ELECT LOOP 22F BIPOLAR SML (ELECTROSURGICAL)
ELECTRD BIVAP BIPO 22/24 DONUT (ELECTROSURGICAL) ×1 IMPLANT
ELECTRODE LOOP 22F BIPOLAR SML (ELECTROSURGICAL) IMPLANT
FIBER LASER 1000 (Laser) ×2 IMPLANT
GLOVE BIO SURGEON STRL SZ8 (GLOVE) ×2 IMPLANT
GLOVE BIOGEL M 8.0 STRL (GLOVE) ×2 IMPLANT
GOWN STANDARD XL  REUSABL (MISCELLANEOUS) ×2 IMPLANT
GOWN STRL REUS W/ TWL LRG LVL3 (GOWN DISPOSABLE) ×2 IMPLANT
GOWN STRL REUS W/ TWL XL LVL3 (GOWN DISPOSABLE) ×1 IMPLANT
GOWN STRL REUS W/TWL LRG LVL3 (GOWN DISPOSABLE) ×2
GOWN STRL REUS W/TWL XL LVL3 (GOWN DISPOSABLE) ×1
HOLDER FOLEY CATH W/STRAP (MISCELLANEOUS) ×4 IMPLANT
KIT TURNOVER CYSTO (KITS) ×2 IMPLANT
LOOP CUT BIPOLAR 24F LRG (ELECTROSURGICAL) ×2 IMPLANT
PACK CYSTO AR (MISCELLANEOUS) ×2 IMPLANT
SENSORWIRE 0.038 NOT ANGLED (WIRE) ×2
SET IRRIG Y TYPE TUR BLADDER L (SET/KITS/TRAYS/PACK) ×2 IMPLANT
SET IRRIGATING DISP (SET/KITS/TRAYS/PACK) IMPLANT
SOL .9 NS 3000ML IRR  AL (IV SOLUTION) ×22
SOL .9 NS 3000ML IRR UROMATIC (IV SOLUTION) ×22 IMPLANT
SYR TOOMEY 50ML (SYRINGE) IMPLANT
SYRINGE IRR TOOMEY STRL 70CC (SYRINGE) ×2 IMPLANT
WATER STERILE IRR 1000ML POUR (IV SOLUTION) ×2 IMPLANT
WATER STERILE IRR 3000ML UROMA (IV SOLUTION) IMPLANT
WIRE SENSOR 0.038 NOT ANGLED (WIRE) ×1 IMPLANT

## 2017-05-28 NOTE — Anesthesia Postprocedure Evaluation (Signed)
Anesthesia Post Note  Patient: James Rush  Procedure(s) Performed: TRANSURETHRAL RESECTION OF THE PROSTATE (TURP) (N/A ) CYSTOSCOPY WITH LITHOLAPAXY (N/A )  Patient location during evaluation: PACU Anesthesia Type: General Level of consciousness: awake and alert Pain management: pain level controlled Vital Signs Assessment: post-procedure vital signs reviewed and stable Respiratory status: spontaneous breathing, nonlabored ventilation, respiratory function stable and patient connected to nasal cannula oxygen Cardiovascular status: blood pressure returned to baseline and stable Postop Assessment: no apparent nausea or vomiting Anesthetic complications: no     Last Vitals:  Vitals:   05/28/17 1357 05/28/17 1427  BP:  116/82  Pulse: 85   Resp:  17  Temp:    SpO2: 97%     Last Pain:  Vitals:   05/28/17 1427  TempSrc:   PainSc: 0-No pain                 Martha Clan

## 2017-05-28 NOTE — Interval H&P Note (Signed)
History and Physical Interval Note:  05/28/2017 7:32 AM  James Rush  has presented today for surgery, with the diagnosis of BPH with obstruction/LUTS, bladder calculi  The various methods of treatment have been discussed with the patient and family. After consideration of risks, benefits and other options for treatment, the patient has consented to  Procedure(s): TRANSURETHRAL RESECTION OF THE PROSTATE (TURP) (N/A) CYSTOSCOPY WITH LITHOLAPAXY (N/A) as a surgical intervention .  The patient's history has been reviewed, patient examined, no change in status, stable for surgery.  I have reviewed the patient's chart and labs.  Questions were answered to the patient's satisfaction.     Morris

## 2017-05-28 NOTE — Transfer of Care (Signed)
Immediate Anesthesia Transfer of Care Note  Patient: James Rush  Procedure(s) Performed: TRANSURETHRAL RESECTION OF THE PROSTATE (TURP) (N/A ) CYSTOSCOPY WITH LITHOLAPAXY (N/A )  Patient Location: PACU  Anesthesia Type:General  Level of Consciousness: drowsy and patient cooperative  Airway & Oxygen Therapy: Patient Spontanous Breathing and Patient connected to face mask oxygen  Post-op Assessment: Report given to RN, Post -op Vital signs reviewed and stable and Patient moving all extremities  Post vital signs: Reviewed and stable  Last Vitals:  Vitals Value Taken Time  BP 102/65 05/28/2017 10:53 AM  Temp 36.6 C 05/28/2017 10:53 AM  Pulse 78 05/28/2017 10:55 AM  Resp 7 05/28/2017 10:55 AM  SpO2 97 % 05/28/2017 10:55 AM  Vitals shown include unvalidated device data.  Last Pain:  Vitals:   05/28/17 1053  TempSrc: Temporal  PainSc:          Complications: No apparent anesthesia complications

## 2017-05-28 NOTE — Op Note (Signed)
Preoperative diagnosis: 1. Bladder outlet obstruction secondary to BPH 2. Multiple bladder calculi  Postoperative diagnosis:  1. Bladder outlet obstruction secondary to BPH 2. Multiple bladder calculi  Procedure:  1. Transurethral resection of the prostate 2. Cystolitholapaxy  Surgeon: Ronda Fairly. James Rush M.D.  Anesthesia: General  Complications: None  EBL: Minimal  Specimens: 1. Prostate chips   Indication: James Rush is a patient with bladder outlet obstruction secondary to benign prostatic hyperplasia.  He had an episode of total gross painless hematuria.  CT urogram showed nonobstructing renal calculi and 4 bladder calculi each measuring between 12-18 mm.  Cystoscopy remarkable for lateral lobe enlargement and a median lobe.  After reviewing the management options for treatment, he elected to proceed with the above surgical procedure(s). We have discussed the potential benefits and risks of the procedure, side effects of the proposed treatment, the likelihood of the patient achieving the goals of the procedure, and any potential problems that might occur during the procedure or recuperation. Informed consent has been obtained.  Description of procedure:  The patient was taken to the operating room and general anesthesia was induced.  The patient was placed in the dorsal lithotomy position, prepped and draped in the usual sterile fashion, and preoperative antibiotics were administered. A preoperative time-out was performed.   The urethral meatus would not accept a 27 French continuous-flow resectoscope sheath with visual obturator.  The distal urethra was dilated with Leander Rams sounds from 22-30 [place] without difficulty.  The resectoscope with visual obturator was then inserted and advanced proximally.  Once in the bladder the [name] resectoscope with loop was placed in the sheath.  The prostate was examined and remarkable for lateral lobe enlargement with a prostatic  urethral length of approximately 3 cm.  There was a an intravesical median lobe and moderate bladder neck elevation.  The bladder calculi were easily visualized.  Ureteral orifices were well away from the bladder neck.  No other bladder mucosal lesions were identified.   The ureteral orifices were identified and marked so as to be avoided during the procedure.  The prostate adenoma was then resected utilizing loop cautery resection with the bipolar cutting loop.  The prostate adenoma from the bladder neck back to the verumontanum was resected beginning at the six o'clock position and then extended to include the right and left lobes of the prostate and anterior prostate. Care was taken not to resect distal to the verumontanum.  Hemostasis was then achieved with the cautery with no significant bleeding noted.    The laser bridge was then placed into the resectoscope sheath.  The bladder calculi were fragmented with a 1000 m holmium laser fiber at settings of 1 J / 40 Hz.  Once fragmented the power was decreased to settings of 0.5 J / 40 Hz.  All stone fragments were removed with a combination of irrigation and fill/empty.  The laser bridge was then replaced with the resectoscope and vaporizing button.  Additional adenoma near the verumontanum was vaporized and hemostasis was achieved.  All specimen and significant size stone fragments were removed.  The resectoscope was removed.  A 24 French three-way Foley catheter would not advance beyond the bladder neck.  This was also unsuccessful with aid of a catheter guide.  A 22 French cystoscope was then passed and there was a slight false passage at the bladder neck.  A sensor wire was placed through the cystoscope and a 24 [place] 3-way catheter was placed over the wire without difficulty.  The balloon was inflated and the catheter was placed to CBI with return of pink tinged effluent.  The patient appeared to tolerate the procedure well and without  complications.  After anesthetic reversal he was transported to the PACU in satisfactory condition.

## 2017-05-28 NOTE — Progress Notes (Signed)
Postop check  no complaints. VSS, afebrile CBI effluent clear on minimal flow  Impression: Doing well status post TURP/cystolitholapaxy Plan: Discontinue CBI and DC home tomorrow

## 2017-05-28 NOTE — Anesthesia Procedure Notes (Addendum)
Procedure Name: Intubation Date/Time: 05/28/2017 7:48 AM Performed by: Nile Riggs, CRNA Pre-anesthesia Checklist: Patient identified, Emergency Drugs available, Suction available, Patient being monitored and Timeout performed Patient Re-evaluated:Patient Re-evaluated prior to induction Oxygen Delivery Method: Circle system utilized Preoxygenation: Pre-oxygenation with 100% oxygen Induction Type: IV induction Ventilation: Mask ventilation without difficulty Laryngoscope Size: McGraph and 4 Grade View: Grade I Tube type: Oral Tube size: 7.5 mm Number of attempts: 2 Airway Equipment and Method: Stylet and Video-laryngoscopy Placement Confirmation: ETT inserted through vocal cords under direct vision,  positive ETCO2,  CO2 detector and breath sounds checked- equal and bilateral Secured at: 21 cm Tube secured with: Tape Dental Injury: Teeth and Oropharynx as per pre-operative assessment  Difficulty Due To: Difficulty was anticipated, Difficult Airway- due to reduced neck mobility and Difficult Airway- due to anterior larynx Comments: Pt was easy mask, but had poor neck mobility and anterior anatomy; making DL with Miller 3 blade difficult. McGrath was used, Grade I view obtained but still required cricoid pressure and careful maneuvering of ETT through vocal cords. Otherwise, atraumatic and VSS throughout induction.

## 2017-05-28 NOTE — Anesthesia Preprocedure Evaluation (Signed)
Anesthesia Evaluation  Patient identified by MRN, date of birth, ID band Patient awake    Reviewed: Allergy & Precautions, H&P , NPO status , Patient's Chart, lab work & pertinent test results, reviewed documented beta blocker date and time   History of Anesthesia Complications Negative for: history of anesthetic complications  Airway Mallampati: II  TM Distance: >3 FB Neck ROM: full    Dental  (+) Dental Advidsory Given, Teeth Intact   Pulmonary neg pulmonary ROS,           Cardiovascular Exercise Tolerance: Good hypertension, (-) angina(-) Past MI (-) dysrhythmias (-) Valvular Problems/Murmurs     Neuro/Psych negative neurological ROS  negative psych ROS   GI/Hepatic negative GI ROS, Neg liver ROS,   Endo/Other  negative endocrine ROS  Renal/GU Renal disease (kidney stones)  negative genitourinary   Musculoskeletal   Abdominal   Peds  Hematology negative hematology ROS (+)   Anesthesia Other Findings Past Medical History: No date: Bladder calculi 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, genital No date: History of kidney stones No date: Hyperlipidemia No date: Hypertension No date: Kidney stone   Reproductive/Obstetrics negative OB ROS                             Anesthesia Physical Anesthesia Plan  ASA: II  Anesthesia Plan: General   Post-op Pain Management:    Induction: Intravenous  PONV Risk Score and Plan: 2 and Ondansetron and Dexamethasone  Airway Management Planned: Oral ETT  Additional Equipment:   Intra-op Plan:   Post-operative Plan: Extubation in OR  Informed Consent: I have reviewed the patients History and Physical, chart, labs and discussed the procedure including the risks, benefits and alternatives for the proposed anesthesia with the patient or authorized representative who has indicated his/her understanding and acceptance.   Dental Advisory Given  Plan Discussed with:  Anesthesiologist, CRNA and Surgeon  Anesthesia Plan Comments:         Anesthesia Quick Evaluation

## 2017-05-28 NOTE — Anesthesia Post-op Follow-up Note (Signed)
Anesthesia QCDR form completed.        

## 2017-05-28 NOTE — Discharge Instructions (Signed)

## 2017-05-28 NOTE — Progress Notes (Signed)
Patient is anxious about his health and potential issues.  Chaplain offered active listening, prayer, and presence to help calm and assure the patient.

## 2017-05-28 NOTE — Progress Notes (Signed)
Dr Raphael Gibney called pt blood pressure 87/63  Lower head and increased fluids  Pt is unresponsive with air way resp rate 12

## 2017-05-28 NOTE — H&P (Signed)
 @  SEGBTDV@ 7:29 AM   James Rush [birth year] [id number]  Referring provider: No referring provider defined for this encounter.   HPI: James Rush is a 57 year old male with moderate to severe lower urinary tract symptoms and history of gross hematuria.  CT urogram remarkable for nonobstructing renal calculi and 4 bladder calculi measuring approximately 1 cm.  Prostate volume was calculated at approximately 80 cc.  Cystoscopy remarkable for trilobar prostate enlargement.  He presents for TURP and cystolitholapaxy.   PMH: Past Medical History:  Diagnosis Date  . Bladder calculi   . Herpes, genital   . History of kidney stones   . Hyperlipidemia   . Hypertension   . Kidney stone     Surgical History: Past Surgical History:  Procedure Laterality Date  . [place]  2012  . SEPTOPLASTY    . VASECTOMY Bilateral 1998    Home Medications:  Reviewed  Allergies: No Known Allergies  Family History: Family History  Problem Relation Age of Onset  . Diabetes Mother   . Hypertension Mother   . Hypertension Father   . Hematuria Neg Hx   . Kidney cancer Neg Hx   . Kidney disease Neg Hx   . Prostate cancer Neg Hx   . Sickle cell trait Neg Hx   . Tuberculosis Neg Hx     Social History:  reports that he has never smoked. He has never used smokeless tobacco. He reports that he does not drink alcohol or use drugs.  ROS: Otherwise noncontributory except as per the HPI  Physical Exam: BP (!) 142/90   Pulse 92   Temp (!) 97.2 F (36.2 C) (Tympanic)   Resp 16   SpO2 99%   Constitutional:  Alert and oriented, No acute distress. HEENT: [place] AT, moist mucus membranes.  Trachea midline, no masses. Cardiovascular: No clubbing, cyanosis, or edema.  RRR Respiratory: Normal respiratory effort, no increased work of breathing.  Lungs clear GI: Abdomen is soft, nontender, nondistended, no abdominal masses GU: No CVA tenderness Lymph: No cervical or inguinal  lymphadenopathy. Skin: No rashes, bruises or suspicious lesions. Neurologic: Grossly intact, no focal deficits, moving all 4 extremities. Psychiatric: Normal mood and affect.  Laboratory Data: Lab Results  Component Value Date   WBC 8.1 05/24/2017   HGB 15.6 05/24/2017   HCT 45.9 05/24/2017   MCV 89.7 05/24/2017   PLT 195 05/24/2017    Lab Results  Component Value Date   CREATININE 1.10 01/27/2017     Assessment & Plan:   57 year old male with significant prostate enlargement and moderate to severe lower urinary tract symptoms with multiple bladder calculi.  He presents for TURP and cystolitholapaxy.  The procedure was discussed in detail including potential risks of bleeding, infection, urethral stricture, bladder neck contracture as well as risk of anesthesia.  Less common risks were discussed including urinary incontinence, erectile dysfunction.  He was informed of the likelihood of retrograde ejaculation.  He indicated all questions were answered and desires to proceed.   James Rush, [place] [address] [place], [place] [address] [phone]

## 2017-05-29 DIAGNOSIS — N401 Enlarged prostate with lower urinary tract symptoms: Secondary | ICD-10-CM | POA: Diagnosis not present

## 2017-05-29 LAB — HEMOGLOBIN AND HEMATOCRIT, BLOOD
HCT: 43.1 % (ref 40.0–52.0)
Hemoglobin: 14.6 g/dL (ref 13.0–18.0)

## 2017-05-29 NOTE — Discharge Summary (Signed)
Physician Discharge Summary  Patient ID: James Rush MRN: [id number] DOB/AGE: Sep 04, 1960 57 y.o.  Admit date: 05/28/2017 Discharge date: 05/29/2017  Admission Diagnoses:  Discharge Diagnoses: BPH, bladder stone Active Problems:   S/P TURP   Discharged Condition: good  Hospital Course: Underwent TURP/cystolitholapaxy yesterday.  He was on continuous bladder irrigation and was able to be weaned off this by today.  Urine output was clear.  Hemoglobin stable.  No complaints, no pain.  Discharged home in stable condition with catheter.  Consults: None  Significant Diagnostic Studies: None  Treatments: surgery: As above  Discharge Exam: Blood pressure (!) 141/85, pulse 92, temperature 97.8 F (36.6 C), temperature source Oral, resp. rate 18, height 5\' 10"  (1.778 m), weight 108.9 kg (240 lb 1.3 oz), SpO2 96 %. General appearance: alert no acute distress Adequate peripheral perfusion of extremities, normal temperature Nonlabored respiration, symmetrical chest rise Soft, nontender, nondistended Three-way Foley catheter in place with clear output.  No evidence of active bleeding.  Disposition: Discharge disposition: 01-Home or Self Care        Allergies as of 05/29/2017   No Known Allergies     Medication List    TAKE these medications   amLODipine 10 MG tablet Commonly known as:  NORVASC Take 1 tablet (10 mg total) by mouth daily. What changed:  when to take this   hydrochlorothiazide 25 MG tablet Commonly known as:  HYDRODIURIL TAKE 1 TABLET BY MOUTH EVERY DAY   HYDROcodone-acetaminophen 5-325 MG tablet Commonly known as:  NORCO/VICODIN Take 1 tablet by mouth every 4 (four) hours as needed for moderate pain.   losartan 100 MG tablet Commonly known as:  COZAAR TAKE 1 TABLET BY MOUTH EVERY DAY   valACYclovir 1000 MG tablet Commonly known as:  VALTREX Take 1 tablet (1,000 mg total) by mouth daily. What changed:    when to take this  reasons to take  this        Signed: Marton Redwood, III 05/29/2017, 11:24 AM

## 2017-05-29 NOTE — Progress Notes (Signed)
Pt discharged per MD order. Pt instructed on how to care for catheter at home. Pt verbalized understanding on how to care for catheter and how to change between his leg bag and overnight bag. Prescription given to pt. IV removed. Pt declined wheelchair and walked to car.

## 2017-05-31 ENCOUNTER — Telehealth: Payer: Self-pay

## 2017-05-31 ENCOUNTER — Ambulatory Visit (INDEPENDENT_AMBULATORY_CARE_PROVIDER_SITE_OTHER): Payer: BLUE CROSS/BLUE SHIELD

## 2017-05-31 VITALS — BP 133/84 | HR 106 | Temp 97.4°F | Resp 16 | Ht 70.0 in | Wt 227.1 lb

## 2017-05-31 DIAGNOSIS — N210 Calculus in bladder: Secondary | ICD-10-CM | POA: Diagnosis not present

## 2017-05-31 DIAGNOSIS — Z87442 Personal history of urinary calculi: Secondary | ICD-10-CM

## 2017-05-31 HISTORY — DX: Personal history of urinary calculi: Z87.442

## 2017-05-31 NOTE — Progress Notes (Signed)
Catheter Removal  Patient is present today for a catheter removal.  50ml of water was drained from the balloon. A 22 FR foley cath was removed from the bladder no complications were noted . Patient tolerated well.  Preformed by: Cristie Hem, CMA  Blood pressure 133/84, pulse (!) 106, temperature (!) 97.4 F (36.3 C), temperature source Oral, resp. rate 16, height 5\' 10"  (1.778 m), weight 227 lb 1.6 oz (103 kg), SpO2 98 %.

## 2017-05-31 NOTE — Telephone Encounter (Signed)
Pt presented in office today for cath removal. Pt had a few questions about when he could return to regular activities. He wants to know when he can start walking on the treadmill and when he can return to work.

## 2017-06-01 NOTE — Telephone Encounter (Signed)
He may start walking on a treadmill in 2 weeks.  He should not lift anything greater than 15 pounds for at least 1 month.  If his job but does not involve this activity he can return to work in 2 weeks.

## 2017-06-02 ENCOUNTER — Telehealth: Payer: Self-pay

## 2017-06-02 DIAGNOSIS — Z1211 Encounter for screening for malignant neoplasm of colon: Secondary | ICD-10-CM

## 2017-06-02 NOTE — Telephone Encounter (Signed)
Pt is aware.  

## 2017-06-02 NOTE — Telephone Encounter (Signed)
Copied from [facility] [phone]. Topic: Referral - Question >> Jun 02, 2017 10:47 AM Hewitt Shorts wrote: Reason for CRM: pt states that we had scheduled him for a colonoscopy last year and would like a new referral to be sent for it again  Best number [phone]

## 2017-06-03 LAB — SURGICAL PATHOLOGY

## 2017-06-04 NOTE — Telephone Encounter (Signed)
Called and spoke w/ pt. Informed him of restrictions, he stated understanding.

## 2017-06-10 DIAGNOSIS — C678 Malignant neoplasm of overlapping sites of bladder: Secondary | ICD-10-CM

## 2017-06-25 ENCOUNTER — Ambulatory Visit: Payer: BLUE CROSS/BLUE SHIELD | Admitting: Urology

## 2017-06-30 ENCOUNTER — Ambulatory Visit: Payer: BLUE CROSS/BLUE SHIELD | Admitting: Urology

## 2017-07-13 ENCOUNTER — Ambulatory Visit (INDEPENDENT_AMBULATORY_CARE_PROVIDER_SITE_OTHER): Payer: BLUE CROSS/BLUE SHIELD | Admitting: Urology

## 2017-07-13 ENCOUNTER — Encounter: Payer: Self-pay | Admitting: Urology

## 2017-07-13 VITALS — BP 114/76 | HR 89 | Ht 70.0 in | Wt 229.5 lb

## 2017-07-13 DIAGNOSIS — Z87442 Personal history of urinary calculi: Secondary | ICD-10-CM

## 2017-07-13 NOTE — Progress Notes (Signed)
07/13/2017 2:59 PM   [name] 1960/07/26 [id number]  Referring provider: Guadalupe Maple, MD [address] [place],  [address]  Chief Complaint  Patient presents with  . Routine Post Op    HPI: 57 year old male status post TURP and cystolitholapaxy for multiple bladder calculi on 05/28/2017.  He had no postoperative problems.  He states he is voiding with an excellent stream.  He denies urinary incontinence.  He has had no recent gross hematuria.  He had 25 g of tissue resected with pathology showing benign prostate tissue.   PMH: Past Medical History:  Diagnosis Date  . Bladder calculi   . Herpes, genital   . History of kidney stones   . Hyperlipidemia   . Hypertension   . Kidney stone     Surgical History: Past Surgical History:  Procedure Laterality Date  . CYSTOSCOPY WITH LITHOLAPAXY N/A 05/28/2017   Procedure: CYSTOSCOPY WITH LITHOLAPAXY;  Surgeon: Abbie Sons, MD;  Location: [facility] ORS;  Service: Urology;  Laterality: N/A;  . HEMORRHOID SURGERY  2012  . SEPTOPLASTY    . TRANSURETHRAL RESECTION OF PROSTATE N/A 05/28/2017   Procedure: TRANSURETHRAL RESECTION OF THE PROSTATE (TURP);  Surgeon: Abbie Sons, MD;  Location: [facility] ORS;  Service: Urology;  Laterality: N/A;  . VASECTOMY Bilateral 1998    Home Medications:  Allergies as of 07/13/2017   No Known Allergies     Medication List        Accurate as of 07/13/17  2:59 PM. Always use your most recent med list.          amLODipine 10 MG tablet Commonly known as:  NORVASC Take 1 tablet (10 mg total) by mouth daily.   hydrochlorothiazide 25 MG tablet Commonly known as:  HYDRODIURIL TAKE 1 TABLET BY MOUTH EVERY DAY   losartan 100 MG tablet Commonly known as:  COZAAR TAKE 1 TABLET BY MOUTH EVERY DAY   valACYclovir 1000 MG tablet Commonly known as:  VALTREX Take 1 tablet (1,000 mg total) by mouth daily.       Allergies: No Known Allergies  Family History: Family History    Problem Relation Age of Onset  . Diabetes Mother   . Hypertension Mother   . Hypertension Father   . Hematuria Neg Hx   . Kidney cancer Neg Hx   . Kidney disease Neg Hx   . Prostate cancer Neg Hx   . Sickle cell trait Neg Hx   . Tuberculosis Neg Hx     Social History:  reports that he has never smoked. He has never used smokeless tobacco. He reports that he does not drink alcohol or use drugs.  ROS: UROLOGY Frequent Urination?: No Hard to postpone urination?: No Burning/pain with urination?: No Get up at night to urinate?: No Leakage of urine?: No Urine stream starts and stops?: No Trouble starting stream?: No Do you have to strain to urinate?: No Blood in urine?: No Urinary tract infection?: No Sexually transmitted disease?: No Injury to kidneys or bladder?: No Painful intercourse?: No Weak stream?: No Erection problems?: No Penile pain?: No  Gastrointestinal Nausea?: No Vomiting?: No Indigestion/heartburn?: No Diarrhea?: No Constipation?: No  Constitutional Fever: No Night sweats?: No Weight loss?: No Fatigue?: No  Skin Skin rash/lesions?: No Itching?: No  Eyes Blurred vision?: No Double vision?: No  Ears/Nose/Throat Sore throat?: No Sinus problems?: No  Hematologic/Lymphatic Swollen glands?: No Easy bruising?: No  Cardiovascular Leg swelling?: No Chest pain?: No  Respiratory Cough?: No  Shortness of breath?: No  Endocrine Excessive thirst?: No  Musculoskeletal Back pain?: No Joint pain?: No  Neurological Headaches?: No Dizziness?: No  Psychologic Depression?: No Anxiety?: No  Physical Exam: BP 114/76 (BP Location: Right Arm, Patient Position: Sitting, Cuff Size: Large)   Pulse 89   Ht 5\' 10"  (1.778 m)   Wt 229 lb 8 oz (104.1 kg)   BMI 32.93 kg/m   Constitutional:  Alert and oriented, No acute distress. HEENT: [place] AT, moist mucus membranes.  Trachea midline, no masses. Cardiovascular: No clubbing, cyanosis, or  edema. Respiratory: Normal respiratory effort, no increased work of breathing. GI: Abdomen is soft, nontender, nondistended, no abdominal masses GU: No CVA tenderness Lymph: No cervical or inguinal lymphadenopathy. Skin: No rashes, bruises or suspicious lesions. Neurologic: Grossly intact, no focal deficits, moving all 4 extremities. Psychiatric: Normal mood and affect.  Laboratory Data: Lab Results  Component Value Date   WBC 8.1 05/24/2017   HGB 14.6 05/29/2017   HCT 43.1 05/29/2017   MCV 89.7 05/24/2017   PLT 195 05/24/2017    Lab Results  Component Value Date   CREATININE 1.10 01/27/2017    Assessment & Plan:   Doing well status post TURP/cystolitholapaxy.  Recommend a follow-up in 3 months with a bladder scan for PVR and KUB.  Abbie Sons, [place] [address] [place], [place] [address] [phone]

## 2017-07-15 ENCOUNTER — Encounter: Payer: Self-pay | Admitting: Gastroenterology

## 2017-07-15 ENCOUNTER — Ambulatory Visit (INDEPENDENT_AMBULATORY_CARE_PROVIDER_SITE_OTHER): Payer: BLUE CROSS/BLUE SHIELD | Admitting: Gastroenterology

## 2017-07-15 ENCOUNTER — Encounter: Payer: Self-pay | Admitting: Urology

## 2017-07-15 VITALS — BP 120/76 | HR 73 | Resp 16 | Ht 70.0 in | Wt 227.6 lb

## 2017-07-15 DIAGNOSIS — Z1211 Encounter for screening for malignant neoplasm of colon: Secondary | ICD-10-CM

## 2017-07-15 DIAGNOSIS — K219 Gastro-esophageal reflux disease without esophagitis: Secondary | ICD-10-CM

## 2017-07-15 MED ORDER — OMEPRAZOLE 40 MG PO CPDR: 40.0000 mg | DELAYED_RELEASE_CAPSULE | Freq: Two times a day (BID) | ORAL | 2 refills | Status: DC

## 2017-07-15 NOTE — Progress Notes (Signed)
Cephas Darby, MD [place]  [place], [place] [address]  Main: [phone]  Fax: [phone]    Gastroenterology Consultation  Referring Provider:     Guadalupe Maple, MD Primary Care Physician:  Guadalupe Maple, MD Primary Gastroenterologist:  Dr. Cephas Darby Reason for Consultation:     Regurgitation and colon cancer screening        HPI:   James Rush is a 57 y.o. male referred by Dr. Guadalupe Maple, MD  for consultation & management of several years history of regurgitation of acid contents as well as bilious contents, especially after eating. The symptoms used to occur on a daily basis until 6 months to 1 year ago which have now become intermittent since he lost weight by following healthy diet and exercise. He lost about 50-60 pounds. He used to take omeprazole or Nexium as needed. He denies epigastric pain, nausea, vomiting. 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 He is also overdue for colon cancer screening  NSAIDs: None  Antiplts/Anticoagulants/Anti thrombotics: None  GI Procedures: None He denies family history of GI malignancy  Past Medical History:  Diagnosis Date  . Bladder calculi   . Herpes, genital   . History of kidney stones   . Hyperlipidemia   . Hypertension   . Kidney stone     Past Surgical History:  Procedure Laterality Date  . CYSTOSCOPY WITH LITHOLAPAXY N/A 05/28/2017   Procedure: CYSTOSCOPY WITH LITHOLAPAXY;  Surgeon: Abbie Sons, MD;  Location: [facility] OR[facility];  Service: Urology;  Laterality: N/A;  . HEMORRHOID SURGERY  2012  . SEPTOPLASTY    . TRANSURETHRAL RESECTION OF PROSTATE N/A 05/28/2017   Procedure: TRANSURETHRAL RESECTION OF THE PROSTATE (TURP);  Surgeon: Abbie Sons, MD;  Location: [facility] OR[facility];  Service: Urology;  Laterality: N/A;  . VASECTOMY Bilateral 1998    Current Outpatient Medications:  .  amLODipine (NORVASC) 10 MG tablet, Take 1 tablet (10 mg total) by mouth daily.  (Patient taking differently: Take 10 mg by mouth every morning. ), Disp: 30 tablet, Rfl: 5 .  hydrochlorothiazide (HYDRODIURIL) 25 MG tablet, TAKE 1 TABLET BY MOUTH EVERY DAY, Disp: 30 tablet, Rfl: 3 .  losartan (COZAAR) 100 MG tablet, TAKE 1 TABLET BY MOUTH EVERY DAY, Disp: 30 tablet, Rfl: 0 .  valACYclovir (VALTREX) 1000 MG tablet, Take 1 tablet (1,000 mg total) by mouth daily. (Patient taking differently: Take 1,000 mg by mouth daily as needed (for outbreaks.). ), Disp: 30 tablet, Rfl: 6 .  omeprazole (PRILOSEC) 40 MG capsule, Take 1 capsule (40 mg total) by mouth 2 (two) times daily before a meal., Disp: 60 capsule, Rfl: 2   Family History  Problem Relation Age of Onset  . Diabetes Mother   . Hypertension Mother   . Hypertension Father   . Hematuria Neg Hx   . Kidney cancer Neg Hx   . Kidney disease Neg Hx   . Prostate cancer Neg Hx   . Sickle cell trait Neg Hx   . Tuberculosis Neg Hx      Social History   Tobacco Use  . Smoking status: Never Smoker  . Smokeless tobacco: Never Used  Substance Use Topics  . Alcohol use: No  . Drug use: No    Allergies as of 07/15/2017  . (No Known Allergies)    Review of Systems:    All systems reviewed and negative except where noted in HPI.  Physical Exam:  BP 120/76 (BP Location: Left Arm, Patient Position: Sitting, Cuff Size: Large)   Pulse 73   Resp 16   Ht 5\' 10"  (1.778 m)   Wt 227 lb 9.6 oz (103.2 kg)   BMI 32.66 kg/m  No LMP for male patient.  General:   Alert,  Well-developed, well-nourished, pleasant and cooperative in NAD Head:  Normocephalic and atraumatic. Eyes:  Sclera clear, no icterus.   Conjunctiva pink. Ears:  Normal auditory acuity. Nose:  No deformity, discharge, or lesions. Mouth:  No deformity or lesions,oropharynx pink & moist. Neck:  Supple; no masses or thyromegaly. Lungs:  Respirations even and unlabored.  Clear throughout to auscultation.   No wheezes, crackles, or rhonchi. No acute  distress. Heart:  Regular rate and rhythm; no murmurs, clicks, rubs, or gallops. Abdomen:  Normal bowel sounds. Soft, non-tender and non-distended without masses, hepatosplenomegaly or hernias noted.  No guarding or rebound tenderness.   Rectal: Not performed Msk:  Symmetrical without gross deformities. Good, equal movement & strength bilaterally. Pulses:  Normal pulses noted. Extremities:  No clubbing or edema.  No cyanosis. Neurologic:  Alert and oriented x3;  grossly normal neurologically. Skin:  Intact without significant lesions or rashes. No jaundice. Lymph Nodes:  No significant cervical adenopathy. Psych:  Alert and cooperative. Normal mood and affect.  Imaging Studies: No abdominal imaging   Assessment and Plan:   James Rush is a 57 y.o. 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 Symptoms have significantly improved since he lost weight and following healthy diet.  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

## 2017-07-16 ENCOUNTER — Ambulatory Visit (INDEPENDENT_AMBULATORY_CARE_PROVIDER_SITE_OTHER): Payer: BLUE CROSS/BLUE SHIELD | Admitting: Family Medicine

## 2017-07-16 ENCOUNTER — Encounter: Payer: Self-pay | Admitting: Family Medicine

## 2017-07-16 VITALS — BP 135/84 | HR 87 | Temp 98.5°F | Wt 229.1 lb

## 2017-07-16 DIAGNOSIS — K219 Gastro-esophageal reflux disease without esophagitis: Secondary | ICD-10-CM

## 2017-07-16 DIAGNOSIS — Z1159 Encounter for screening for other viral diseases: Secondary | ICD-10-CM | POA: Diagnosis not present

## 2017-07-16 DIAGNOSIS — D229 Melanocytic nevi, unspecified: Secondary | ICD-10-CM

## 2017-07-16 DIAGNOSIS — Z114 Encounter for screening for human immunodeficiency virus [HIV]: Secondary | ICD-10-CM

## 2017-07-16 DIAGNOSIS — R7301 Impaired fasting glucose: Secondary | ICD-10-CM

## 2017-07-16 DIAGNOSIS — I10 Essential (primary) hypertension: Secondary | ICD-10-CM

## 2017-07-16 LAB — BAYER DCA HB A1C WAIVED: HB A1C (BAYER DCA - WAIVED): 5.4 % (ref <7.0)

## 2017-07-16 MED ORDER — HYDROCHLOROTHIAZIDE 25 MG PO TABS: 25.0000 mg | ORAL_TABLET | Freq: Every day | ORAL | 1 refills | Status: DC

## 2017-07-16 MED ORDER — OMEPRAZOLE 40 MG PO CPDR: 40.0000 mg | DELAYED_RELEASE_CAPSULE | Freq: Two times a day (BID) | ORAL | 1 refills | Status: DC

## 2017-07-16 MED ORDER — VALACYCLOVIR HCL 1 G PO TABS: 1000.0000 mg | ORAL_TABLET | Freq: Every day | ORAL | 1 refills | Status: DC

## 2017-07-16 MED ORDER — LOSARTAN POTASSIUM 100 MG PO TABS: 100.0000 mg | ORAL_TABLET | Freq: Every day | ORAL | 1 refills | Status: DC

## 2017-07-16 MED ORDER — AMLODIPINE BESYLATE 10 MG PO TABS: 10.0000 mg | ORAL_TABLET | Freq: Every day | ORAL | 1 refills | Status: DC

## 2017-07-16 NOTE — Assessment & Plan Note (Signed)
Under good control. Continue current regimen. Continue to monitor. Call with any concerns. Refills given today. 

## 2017-07-16 NOTE — Progress Notes (Signed)
BP 135/84 (BP Location: Right Arm, Patient Position: Sitting, Cuff Size: Large)   Pulse 87   Temp 98.5 F (36.9 C)   Wt 229 lb 2 oz (103.9 kg)   SpO2 97%   BMI 32.88 kg/m    Subjective:    Patient ID: James Rush, male    DOB: 03/31/1960, 57 y.o.   MRN: [id number]  HPI: James Rush is a 57 y.o. male  Chief Complaint  Patient presents with  . Hypertension   HYPERTENSION Hypertension status: controlled  Satisfied with current treatment? yes Duration of hypertension: chronic BP monitoring frequency:  not checking BP medication side effects:  no Medication compliance: excellent compliance Previous BP meds: amlodipine, hctz, losartan Aspirin: no Recurrent headaches: no Visual changes: no Palpitations: no Dyspnea: no Chest pain: no Lower extremity edema: no Dizzy/lightheaded: no  Impaired Fasting Glucose HbA1C: 5.4 Duration of elevated blood sugar: chronic Polydipsia: no Polyuria: no Weight change: no Visual disturbance: no Glucose Monitoring: no Diabetic Education: Completed   Relevant past medical, surgical, family and social history reviewed and updated as indicated. Interim medical history since our last visit reviewed. Allergies and medications reviewed and updated.  Review of Systems  Constitutional: Negative.   Respiratory: Negative.   Cardiovascular: Negative.   Musculoskeletal: Negative.   Neurological: Negative.   Psychiatric/Behavioral: Negative.     Per HPI unless specifically indicated above     Objective:    BP 135/84 (BP Location: Right Arm, Patient Position: Sitting, Cuff Size: Large)   Pulse 87   Temp 98.5 F (36.9 C)   Wt 229 lb 2 oz (103.9 kg)   SpO2 97%   BMI 32.88 kg/m   Wt Readings from Last 3 Encounters:  07/16/17 229 lb 2 oz (103.9 kg)  07/15/17 227 lb 9.6 oz (103.2 kg)  07/13/17 229 lb 8 oz (104.1 kg)    Physical Exam  Constitutional: He is oriented to person, place, and time. He appears well-developed  and well-nourished. No distress.  HENT:  Head: Normocephalic and atraumatic.  Right Ear: Hearing normal.  Left Ear: Hearing normal.  Nose: Nose normal.  Eyes: Conjunctivae and lids are normal. Right eye exhibits no discharge. Left eye exhibits no discharge. No scleral icterus.  Cardiovascular: Normal rate, regular rhythm, normal heart sounds and intact distal pulses. Exam reveals no gallop and no friction rub.  No murmur heard. Pulmonary/Chest: Effort normal and breath sounds normal. No stridor. No respiratory distress. He has no wheezes. He has no rales. He exhibits no tenderness.  Musculoskeletal: Normal range of motion.  Neurological: He is alert and oriented to person, place, and time.  Skin: Skin is warm, dry and intact. Capillary refill takes less than 2 seconds. No rash noted. He is not diaphoretic. No erythema. No pallor.  Numerous moles  Psychiatric: He has a normal mood and affect. His speech is normal and behavior is normal. Judgment and thought content normal. Cognition and memory are normal.    Results for orders placed or performed during the hospital encounter of 05/28/17  CBC  Result Value Ref Range   WBC 8.1 3.8 - 10.6 K/uL   RBC 5.11 4.40 - 5.90 MIL/uL   Hemoglobin 15.6 13.0 - 18.0 g/dL   HCT 45.9 40.0 - 52.0 %   MCV 89.7 80.0 - 100.0 fL   MCH 30.5 26.0 - 34.0 pg   MCHC 34.0 32.0 - 36.0 g/dL   RDW 12.9 11.5 - 14.5 %   Platelets 195 150 - 440  K/uL  Hemoglobin and hematocrit, blood  Result Value Ref Range   Hemoglobin 14.6 13.0 - 18.0 g/dL   HCT 43.1 40.0 - 52.0 %  Surgical pathology  Result Value Ref Range   SURGICAL PATHOLOGY      Surgical Pathology CASE: [id number] PATIENT: Genella Rife Surgical Pathology Report     SPECIMEN SUBMITTED: A. Prostate chips  CLINICAL HISTORY: None provided  PRE-OPERATIVE DIAGNOSIS: BPH with obstruction/ LUTS, bladder calculi  POST-OPERATIVE DIAGNOSIS: Same as pre op     DIAGNOSIS: A. PROSTATE CHIPS;  TURP: - BENIGN PROSTATIC TISSUE WITH GLANDULAR AND STROMAL HYPERPLASIA. - FOCAL ATROPHY. - MILD NONSPECIFIC INFLAMMATION.  Note: A PIN3 immunostain is obtained and supports focal atrophy.  The control slide stained appropriately.   GROSS DESCRIPTION:  A. Labeled: prostate chips  Specimen type:  TURP  Specimen size: 10.3 x 6.3 x 1.1 cm  Specimen weight: 25 grams  Other: received in formalin rubbery pink-tan fragments of tissue and blood clot  Blocks: 1-13-representative tissue   Final Diagnosis performed by Delorse Lek, MD.  Electronically signed 06/03/2017 10:54:45AM    The electronic signature indicates that the named Attending  Pathologist has evaluated the specimen  Technical component performed at [facility], [address], [place], [place] [address] Lab: [phone] Dir: Rush Farmer, MD, MMM  Professional component performed at [facility], [facility], [place], [place], [place] [address] Lab: [phone] Dir: Dellia Nims. Reuel Derby, MD        Assessment & Plan:   Problem List Items Addressed This Visit      Cardiovascular and Mediastinum   Essential hypertension - Primary    Under good control. Continue current regimen. Continue to monitor. Call with any concerns. Refills given today.       Relevant Medications   amLODipine (NORVASC) 10 MG tablet   hydrochlorothiazide (HYDRODIURIL) 25 MG tablet   losartan (COZAAR) 100 MG tablet   Other Relevant Orders   Basic metabolic panel     Endocrine   Impaired fasting glucose    Under good control with A1c of 5.4- will resolve off his problem list. Continue diet and exercise Continue to monitor. Call with any concerns.       Relevant Orders   Bayer DCA Hb A1c Waived    Other Visit Diagnoses    Encounter for hepatitis C screening test for low risk patient       Labs drawn today. Await results.    Relevant Orders   Hepatitis C antibody   Screening for HIV (human immunodeficiency virus)        Labs drawn today. Await results.    Relevant Orders   HIV antibody   Numerous moles       Would like to see dermatology. Referral generated today.   Relevant Orders   Ambulatory referral to Dermatology   Gastroesophageal reflux disease, esophagitis presence not specified       Under good control. Continue current regimen. Call with any concerns. Refills given today.   Relevant Medications   omeprazole (PRILOSEC) 40 MG capsule       Follow up plan: Return in about 6 months (around 01/16/2018) for Physical with MAC.

## 2017-07-16 NOTE — Assessment & Plan Note (Signed)
Under good control with A1c of 5.4- will resolve off his problem list. Continue diet and exercise Continue to monitor. Call with any concerns.

## 2017-07-17 LAB — HIV ANTIBODY (ROUTINE TESTING W REFLEX): HIV SCREEN 4TH GENERATION: NONREACTIVE

## 2017-07-17 LAB — BASIC METABOLIC PANEL
BUN/Creatinine Ratio: 16 (ref 9–20)
BUN: 18 mg/dL (ref 6–24)
CALCIUM: 9.8 mg/dL (ref 8.7–10.2)
CHLORIDE: 104 mmol/L (ref 96–106)
CO2: 20 mmol/L (ref 20–29)
CREATININE: 1.15 mg/dL (ref 0.76–1.27)
GFR, EST AFRICAN AMERICAN: 81 mL/min/{1.73_m2} (ref >59)
GFR, EST NON AFRICAN AMERICAN: 70 mL/min/{1.73_m2} (ref >59)
Glucose: 115 mg/dL — ABNORMAL HIGH (ref 65–99)
POTASSIUM: 3.8 mmol/L (ref 3.5–5.2)
Sodium: 139 mmol/L (ref 134–144)

## 2017-07-17 LAB — HEPATITIS C ANTIBODY

## 2017-07-19 ENCOUNTER — Telehealth: Payer: Self-pay | Admitting: Family Medicine

## 2017-07-19 NOTE — Telephone Encounter (Signed)
Please let him know that his labs came back nice and normal. Thanks! 

## 2017-07-20 NOTE — Telephone Encounter (Signed)
Called and left a message on patient's cell phone.

## 2017-08-11 ENCOUNTER — Telehealth: Payer: Self-pay | Admitting: Gastroenterology

## 2017-08-11 NOTE — Telephone Encounter (Signed)
Pt left vm to see if he can schedule colonoscopy with Dr. Marius Ditch for  June 26th ,27th, or 28th please call pt

## 2017-08-12 ENCOUNTER — Other Ambulatory Visit: Payer: Self-pay

## 2017-08-12 DIAGNOSIS — K219 Gastro-esophageal reflux disease without esophagitis: Secondary | ICD-10-CM

## 2017-08-12 DIAGNOSIS — Z1211 Encounter for screening for malignant neoplasm of colon: Secondary | ICD-10-CM

## 2017-08-13 ENCOUNTER — Other Ambulatory Visit: Payer: Self-pay

## 2017-08-13 DIAGNOSIS — K219 Gastro-esophageal reflux disease without esophagitis: Secondary | ICD-10-CM

## 2017-08-13 DIAGNOSIS — Z1211 Encounter for screening for malignant neoplasm of colon: Secondary | ICD-10-CM

## 2017-08-19 ENCOUNTER — Other Ambulatory Visit: Payer: Self-pay

## 2017-08-24 NOTE — Discharge Instructions (Signed)
General Anesthesia, Adult, Care After °These instructions provide you with information about caring for yourself after your procedure. Your health care provider may also give you more specific instructions. Your treatment has been planned according to current medical practices, but problems sometimes occur. Call your health care provider if you have any problems or questions after your procedure. °What can I expect after the procedure? °After the procedure, it is common to have: °· Vomiting. °· A sore throat. °· Mental slowness. ° °It is common to feel: °· Nauseous. °· Cold or shivery. °· Sleepy. °· Tired. °· Sore or achy, even in parts of your body where you did not have surgery. ° °Follow these instructions at home: °For at least 24 hours after the procedure: °· Do not: °? Participate in activities where you could fall or become injured. °? Drive. °? Use heavy machinery. °? Drink alcohol. °? Take sleeping pills or medicines that cause drowsiness. °? Make important decisions or sign legal documents. °? Take care of children on your own. °· Rest. °Eating and drinking °· If you vomit, drink water, juice, or soup when you can drink without vomiting. °· Drink enough fluid to keep your urine clear or pale yellow. °· Make sure you have little or no nausea before eating solid foods. °· Follow the diet recommended by your health care provider. °General instructions °· Have a responsible adult stay with you until you are awake and alert. °· Return to your normal activities as told by your health care provider. Ask your health care provider what activities are safe for you. °· Take over-the-counter and prescription medicines only as told by your health care provider. °· If you smoke, do not smoke without supervision. °· Keep all follow-up visits as told by your health care provider. This is important. °Contact a health care provider if: °· You continue to have nausea or vomiting at home, and medicines are not helpful. °· You  cannot drink fluids or start eating again. °· You cannot urinate after 8-12 hours. °· You develop a skin rash. °· You have fever. °· You have increasing redness at the site of your procedure. °Get help right away if: °· You have difficulty breathing. °· You have chest pain. °· You have unexpected bleeding. °· You feel that you are having a life-threatening or urgent problem. °This information is not intended to replace advice given to you by your health care provider. Make sure you discuss any questions you have with your health care provider. °Document Released: 05/25/2000 Document Revised: 07/22/2015 Document Reviewed: 01/31/2015 °Elsevier Interactive Patient Education © 2018 [employer] ° °

## 2017-08-25 ENCOUNTER — Ambulatory Visit
Admission: RE | Admit: 2017-08-25 | Discharge: 2017-08-25 | Disposition: A | Discharge status: Home / Self Care | Payer: BLUE CROSS/BLUE SHIELD | Source: Ambulatory Visit | Attending: Gastroenterology | Admitting: Gastroenterology

## 2017-08-25 ENCOUNTER — Ambulatory Visit: Payer: BLUE CROSS/BLUE SHIELD | Admitting: Anesthesiology

## 2017-08-25 ENCOUNTER — Encounter
Admission: RE | Disposition: A | Discharge status: Home / Self Care | Payer: Self-pay | Source: Ambulatory Visit | Attending: Gastroenterology

## 2017-08-25 ENCOUNTER — Ambulatory Visit: Admit: 2017-08-25 | Payer: BLUE CROSS/BLUE SHIELD | Admitting: Gastroenterology

## 2017-08-25 DIAGNOSIS — I10 Essential (primary) hypertension: Secondary | ICD-10-CM | POA: Diagnosis not present

## 2017-08-25 DIAGNOSIS — K621 Rectal polyp: Secondary | ICD-10-CM | POA: Diagnosis not present

## 2017-08-25 DIAGNOSIS — Z87442 Personal history of urinary calculi: Secondary | ICD-10-CM | POA: Insufficient documentation

## 2017-08-25 DIAGNOSIS — E785 Hyperlipidemia, unspecified: Secondary | ICD-10-CM | POA: Diagnosis not present

## 2017-08-25 DIAGNOSIS — D120 Benign neoplasm of cecum: Secondary | ICD-10-CM | POA: Insufficient documentation

## 2017-08-25 DIAGNOSIS — K648 Other hemorrhoids: Secondary | ICD-10-CM | POA: Diagnosis not present

## 2017-08-25 DIAGNOSIS — K449 Diaphragmatic hernia without obstruction or gangrene: Secondary | ICD-10-CM | POA: Insufficient documentation

## 2017-08-25 DIAGNOSIS — R110 Nausea: Secondary | ICD-10-CM

## 2017-08-25 DIAGNOSIS — K644 Residual hemorrhoidal skin tags: Secondary | ICD-10-CM | POA: Insufficient documentation

## 2017-08-25 DIAGNOSIS — Z8249 Family history of ischemic heart disease and other diseases of the circulatory system: Secondary | ICD-10-CM | POA: Diagnosis not present

## 2017-08-25 DIAGNOSIS — R1110 Vomiting, unspecified: Secondary | ICD-10-CM | POA: Insufficient documentation

## 2017-08-25 DIAGNOSIS — K3189 Other diseases of stomach and duodenum: Secondary | ICD-10-CM | POA: Diagnosis not present

## 2017-08-25 DIAGNOSIS — D128 Benign neoplasm of rectum: Secondary | ICD-10-CM | POA: Diagnosis not present

## 2017-08-25 DIAGNOSIS — Z1211 Encounter for screening for malignant neoplasm of colon: Secondary | ICD-10-CM | POA: Diagnosis not present

## 2017-08-25 DIAGNOSIS — K219 Gastro-esophageal reflux disease without esophagitis: Secondary | ICD-10-CM

## 2017-08-25 DIAGNOSIS — Z79899 Other long term (current) drug therapy: Secondary | ICD-10-CM | POA: Diagnosis not present

## 2017-08-25 HISTORY — PX: COLONOSCOPY WITH PROPOFOL: SHX5780

## 2017-08-25 HISTORY — PX: ESOPHAGOGASTRODUODENOSCOPY (EGD) WITH PROPOFOL: SHX5813

## 2017-08-25 SURGERY — COLONOSCOPY WITH PROPOFOL
Anesthesia: Choice

## 2017-08-25 SURGERY — COLONOSCOPY WITH PROPOFOL
Anesthesia: General | Wound class: Clean Contaminated

## 2017-08-25 MED ORDER — ONDANSETRON HCL 4 MG/2ML IJ SOLN: 4.0000 mg | Freq: Once | INTRAMUSCULAR | Status: DC | PRN

## 2017-08-25 MED ORDER — GLYCOPYRROLATE 0.2 MG/ML IJ SOLN
INTRAMUSCULAR | Status: DC | PRN
  Administered 2017-08-25: 0.2 mg via INTRAVENOUS

## 2017-08-25 MED ORDER — LIDOCAINE HCL URETHRAL/MUCOSAL 2 % EX GEL
CUTANEOUS | Status: DC | PRN
  Administered 2017-08-25: 1

## 2017-08-25 MED ORDER — PROPOFOL 10 MG/ML IV BOLUS
INTRAVENOUS | Status: DC | PRN
  Administered 2017-08-25 (×6): 20 mg via INTRAVENOUS
  Administered 2017-08-25: 40 mg via INTRAVENOUS
  Administered 2017-08-25: 30 mg via INTRAVENOUS
  Administered 2017-08-25: 20 mg via INTRAVENOUS
  Administered 2017-08-25: 80 mg via INTRAVENOUS
  Administered 2017-08-25 (×3): 20 mg via INTRAVENOUS
  Administered 2017-08-25: 30 mg via INTRAVENOUS
  Administered 2017-08-25: 40 mg via INTRAVENOUS
  Administered 2017-08-25 (×3): 20 mg via INTRAVENOUS

## 2017-08-25 MED ORDER — LACTATED RINGERS IV SOLN
10.0000 mL/h | INTRAVENOUS | Status: DC
  Administered 2017-08-25 (×2): via INTRAVENOUS
  Administered 2017-08-25: 10 mL/h via INTRAVENOUS

## 2017-08-25 MED ORDER — LIDOCAINE HCL (CARDIAC) PF 100 MG/5ML IV SOSY
PREFILLED_SYRINGE | INTRAVENOUS | Status: DC | PRN
  Administered 2017-08-25: 100 mg via INTRAVENOUS

## 2017-08-25 MED ORDER — SODIUM CHLORIDE 0.9 % IV SOLN: INTRAVENOUS | Status: DC

## 2017-08-25 MED ORDER — STERILE WATER FOR IRRIGATION IR SOLN
Status: DC | PRN
  Administered 2017-08-25: 10:00:00

## 2017-08-25 SURGICAL SUPPLY — 39 items
BALLN DILATOR 10-12 8 (BALLOONS)
BALLN DILATOR 12-15 8 (BALLOONS)
BALLN DILATOR 15-18 8 (BALLOONS)
BALLN DILATOR CRE 0-12 8 (BALLOONS)
BALLN DILATOR ESOPH 8 10 CRE (MISCELLANEOUS) IMPLANT
BALLOON DILATOR 12-15 8 (BALLOONS) IMPLANT
BALLOON DILATOR 15-18 8 (BALLOONS) IMPLANT
BALLOON DILATOR CRE 0-12 8 (BALLOONS) IMPLANT
BLOCK BITE 60FR ADLT L/F GRN (MISCELLANEOUS) ×2 IMPLANT
CANISTER SUCT 1200ML W/VALVE (MISCELLANEOUS) ×2 IMPLANT
CLIP HMST 235XBRD CATH ROT (MISCELLANEOUS) IMPLANT
CLIP RESOLUTION 360 11X235 (MISCELLANEOUS)
COLOWRAP XLRG (MISCELLANEOUS) ×2
COMPRESSION COLOWRAP XLRG (MISCELLANEOUS) ×1 IMPLANT
ELECT REM PT RETURN 9FT ADLT (ELECTROSURGICAL)
ELECTRODE REM PT RTRN 9FT ADLT (ELECTROSURGICAL) IMPLANT
FCP ESCP3.2XJMB 240X2.8X (MISCELLANEOUS) ×1
FORCEPS BIOP RAD 4 LRG CAP 4 (CUTTING FORCEPS) IMPLANT
FORCEPS BIOP RJ4 240 W/NDL (MISCELLANEOUS) ×1
FORCEPS ESCP3.2XJMB 240X2.8X (MISCELLANEOUS) ×1 IMPLANT
GOWN CVR UNV OPN BCK APRN NK (MISCELLANEOUS) ×2 IMPLANT
GOWN ISOL THUMB LOOP REG UNIV (MISCELLANEOUS) ×2
INJECTOR VARIJECT VIN23 (MISCELLANEOUS) IMPLANT
KIT DEFENDO VALVE AND CONN (KITS) IMPLANT
KIT ENDO PROCEDURE OLY (KITS) ×2 IMPLANT
MARKER SPOT ENDO TATTOO 5ML (MISCELLANEOUS) IMPLANT
PROBE APC STR FIRE (PROBE) IMPLANT
RETRIEVER NET PLAT FOOD (MISCELLANEOUS) IMPLANT
RETRIEVER NET ROTH 2.5X230 LF (MISCELLANEOUS) IMPLANT
SNARE COLD EXACTO (MISCELLANEOUS) IMPLANT
SNARE SHORT THROW 13M SML OVAL (MISCELLANEOUS) IMPLANT
SNARE SHORT THROW 30M LRG OVAL (MISCELLANEOUS) IMPLANT
SNARE SNG USE RND 15MM (INSTRUMENTS) IMPLANT
SPOT EX ENDOSCOPIC TATTOO (MISCELLANEOUS)
SYR INFLATION 60ML (SYRINGE) IMPLANT
TRAP ETRAP POLY (MISCELLANEOUS) IMPLANT
VARIJECT INJECTOR VIN23 (MISCELLANEOUS)
WATER STERILE IRR 250ML POUR (IV SOLUTION) ×2 IMPLANT
WIRE CRE 18-20MM 8CM F G (MISCELLANEOUS) IMPLANT

## 2017-08-25 NOTE — Op Note (Signed)
[facility] Gastroenterology Patient Name: James Rush Procedure Date: 08/25/2017 8:58 AM MRN: [id number] Account #: [id number] Date of Birth: February 10, 1961 Admit Type: Outpatient Age: 57 Room: [facility] OR ROOM 01 Gender: Male Note Status: Finalized Procedure:            Upper GI endoscopy Indications:          Regurgitation Providers:            Lin Landsman MD, MD Referring MD:         Guadalupe Maple, MD (Referring MD) Medicines:            Monitored Anesthesia Care Complications:        No immediate complications. Estimated blood loss: None. Procedure:            Pre-Anesthesia Assessment:                       - Prior to the procedure, a History and Physical was                        performed, and patient medications and allergies were                        reviewed. The patient is competent. The risks and                        benefits of the procedure and the sedation options and                        risks were discussed with the patient. All questions                        were answered and informed consent was obtained.                        Patient identification and proposed procedure were                        verified by the physician, the nurse, the                        anesthesiologist, the anesthetist and the technician in                        the pre-procedure area in the procedure room in the                        endoscopy suite. Mental Status Examination: alert and                        oriented. Airway Examination: normal oropharyngeal                        airway and neck mobility. Respiratory Examination:                        clear to auscultation. CV Examination: normal.                        Prophylactic Antibiotics: The patient does not require  prophylactic antibiotics. Prior Anticoagulants: The                        patient has taken no previous anticoagulant or   antiplatelet agents. ASA Grade Assessment: II - A                        patient with mild systemic disease. After reviewing the                        risks and benefits, the patient was deemed in                        satisfactory condition to undergo the procedure. The                        anesthesia plan was to use monitored anesthesia care                        (MAC). Immediately prior to administration of                        medications, the patient was re-assessed for adequacy                        to receive sedatives. The heart rate, respiratory rate,                        oxygen saturations, blood pressure, adequacy of                        pulmonary ventilation, and response to care were                        monitored throughout the procedure. The physical status                        of the patient was re-assessed after the procedure.                       After obtaining informed consent, the endoscope was                        passed under direct vision. Throughout the procedure,                        the patient's blood pressure, pulse, and oxygen                        saturations were monitored continuously. The was                        introduced through the mouth, and advanced to the                        second part of duodenum. The upper GI endoscopy was                        accomplished without difficulty. The patient tolerated  the procedure well. Findings:      The gastroesophageal junction and examined esophagus were normal.      A 1 cm hiatal hernia was present.      A single 6 mm submucosal papule (nodule) with no bleeding and no       stigmata of recent bleeding was found on the greater curvature of the       gastric antrum. Biopsies were taken with a cold forceps for histology.      The cardia and gastric fundus were normal on retroflexion.      Normal mucosa was found in the entire examined stomach. Biopsies were        taken with a cold forceps for Helicobacter pylori testing.      The duodenal bulb and second portion of the duodenum were normal. Impression:           - Normal gastroesophageal junction and esophagus.                       - 1 cm hiatal hernia.                       - A single submucosal papule (nodule) found in the                        stomach. Biopsied.                       - Normal mucosa was found in the entire stomach.                        Biopsied.                       - Normal duodenal bulb and second portion of the                        duodenum. Recommendation:       - Await pathology results.                       - Continue present medications. Procedure Code(s):    --- Professional ---                       [phone], Esophagogastroduodenoscopy, flexible, transoral;                        with biopsy, single or multiple Diagnosis Code(s):    --- Professional ---                       K44.9, Diaphragmatic hernia without obstruction or                        gangrene                       K31.89, Other diseases of stomach and duodenum                       R11.10, Vomiting, unspecified CPT copyright 2017 American Medical Association. All rights reserved. The codes documented in this report are preliminary and upon coder review may  be revised to meet current compliance requirements. Dr. Ulyess Mort Deonta Bomberger  Raeanne Gathers MD, MD 08/25/2017 9:24:32 AM This report has been signed electronically. Number of Addenda: 0 Note Initiated On: 08/25/2017 8:58 AM      [facility]

## 2017-08-25 NOTE — Anesthesia Postprocedure Evaluation (Signed)
Anesthesia Post Note  Patient: James Rush  Procedure(s) Performed: COLONOSCOPY WITH PROPOFOL (N/A ) ESOPHAGOGASTRODUODENOSCOPY (EGD) WITH PROPOFOL (N/A )  Patient location during evaluation: PACU Anesthesia Type: General Level of consciousness: awake Pain management: pain level controlled Vital Signs Assessment: post-procedure vital signs reviewed and stable Respiratory status: respiratory function stable Cardiovascular status: stable Postop Assessment: no signs of nausea or vomiting Anesthetic complications: no    Veda Canning

## 2017-08-25 NOTE — Anesthesia Preprocedure Evaluation (Addendum)
Anesthesia Evaluation  Patient identified by MRN, date of birth, ID band  Reviewed: Allergy & Precautions, NPO status , Patient's Chart, lab work & pertinent test results  Airway Mallampati: II  TM Distance: >3 FB     Dental   Pulmonary neg pulmonary ROS, neg recent URI,    breath sounds clear to auscultation       Cardiovascular Exercise Tolerance: Good hypertension, (-) angina Rhythm:Regular Rate:Normal  HLD   Neuro/Psych    GI/Hepatic negative GI ROS, neg GERD  ,  Endo/Other  BMI 33  Renal/GU Hx kidney stones     Musculoskeletal   Abdominal   Peds  Hematology negative hematology ROS (+)   Anesthesia Other Findings   Reproductive/Obstetrics                             Anesthesia Physical Anesthesia Plan  ASA: II  Anesthesia Plan: General   Post-op Pain Management:    Induction: Intravenous  PONV Risk Score and Plan: TIVA  Airway Management Planned: Natural Airway  Additional Equipment:   Intra-op Plan:   Post-operative Plan:   Informed Consent: I have reviewed the patients History and Physical, chart, labs and discussed the procedure including the risks, benefits and alternatives for the proposed anesthesia with the patient or authorized representative who has indicated his/her understanding and acceptance.     Plan Discussed with: CRNA  Anesthesia Plan Comments:         Anesthesia Quick Evaluation

## 2017-08-25 NOTE — Transfer of Care (Signed)
Immediate Anesthesia Transfer of Care Note  Patient: James Rush  Procedure(s) Performed: COLONOSCOPY WITH PROPOFOL (N/A ) ESOPHAGOGASTRODUODENOSCOPY (EGD) WITH PROPOFOL (N/A )  Patient Location: PACU  Anesthesia Type: General  Level of Consciousness: awake, alert  and patient cooperative  Airway and Oxygen Therapy: Patient Spontanous Breathing and Patient connected to supplemental oxygen  Post-op Assessment: Post-op Vital signs reviewed, Patient's Cardiovascular Status Stable, Respiratory Function Stable, Patent Airway and No signs of Nausea or vomiting  Post-op Vital Signs: Reviewed and stable  Complications: No apparent anesthesia complications

## 2017-08-25 NOTE — H&P (Signed)
James Darby, MD [address]  [place]  [place],  [address]  Main: [phone]  Fax: [phone] Pager: [phone]  Primary Care Physician:  Guadalupe Maple, MD Primary Gastroenterologist:  Dr. Cephas Rush  Pre-Procedure History & Physical: HPI:  James Rush is a 57 y.o. male is here for an endoscopy and colonoscopy.   Past Medical History:  Diagnosis Date  . Bladder calculi   . Herpes, genital   . History of kidney stones 05/2017  . Hyperlipidemia   . Hypertension    controlled  . Kidney stone     Past Surgical History:  Procedure Laterality Date  . CYSTOSCOPY WITH LITHOLAPAXY N/A 05/28/2017   Procedure: CYSTOSCOPY WITH LITHOLAPAXY;  Surgeon: Abbie Sons, MD;  Location: [facility] ORS;  Service: Urology;  Laterality: N/A;  . HEMORRHOID SURGERY  2012  . SEPTOPLASTY    . TRANSURETHRAL RESECTION OF PROSTATE N/A 05/28/2017   Procedure: TRANSURETHRAL RESECTION OF THE PROSTATE (TURP);  Surgeon: Abbie Sons, MD;  Location: [facility] ORS;  Service: Urology;  Laterality: N/A;  . VASECTOMY Bilateral 1998    Prior to Admission medications   Medication Sig Start Date End Date Taking? Authorizing Provider  amLODipine (NORVASC) 10 MG tablet Take 1 tablet (10 mg total) by mouth daily. Patient taking differently: Take 10 mg by mouth daily. am 07/16/17  Yes Johnson, Megan P, DO  hydrochlorothiazide (HYDRODIURIL) 25 MG tablet Take 1 tablet (25 mg total) by mouth daily. Patient taking differently: Take 25 mg by mouth daily. am 07/16/17  Yes Johnson, Megan P, DO  losartan (COZAAR) 100 MG tablet Take 1 tablet (100 mg total) by mouth daily. Patient taking differently: Take 100 mg by mouth daily. am 07/16/17  Yes Johnson, Megan P, DO  valACYclovir (VALTREX) 1000 MG tablet Take 1 tablet (1,000 mg total) by mouth daily. Patient taking differently: Take 1,000 mg by mouth as needed.  07/16/17  Yes Johnson, Megan P, DO  omeprazole (PRILOSEC) 40 MG capsule Take 1 capsule  (40 mg total) by mouth 2 (two) times daily before a meal. Patient not taking: Reported on 08/19/2017 07/16/17 10/14/17  Park Liter P, DO    Allergies as of 08/12/2017  . (No Known Allergies)    Family History  Problem Relation Age of Onset  . Diabetes Mother   . Hypertension Mother   . Hypertension Father   . Hematuria Neg Hx   . Kidney cancer Neg Hx   . Kidney disease Neg Hx   . Prostate cancer Neg Hx   . Sickle cell trait Neg Hx   . Tuberculosis Neg Hx     Social History   Socioeconomic History  . Marital status: Divorced    Spouse name: Not on file  . Number of children: Not on file  . Years of education: Not on file  . Highest education level: Not on file  Occupational History  . Not on file  Social Needs  . Financial resource strain: Not on file  . Food insecurity:    Worry: Not on file    Inability: Not on file  . Transportation needs:    Medical: Not on file    Non-medical: Not on file  Tobacco Use  . Smoking status: Never Smoker  . Smokeless tobacco: Never Used  Substance and Sexual Activity  . Alcohol use: No  . Drug use: No  . Sexual activity: Not on file  Lifestyle  . Physical activity:    Days per  week: Not on file    Minutes per session: Not on file  . Stress: Not on file  Relationships  . Social connections:    Talks on phone: Not on file    Gets together: Not on file    Attends religious service: Not on file    Active member of club or organization: Not on file    Attends meetings of clubs or organizations: Not on file    Relationship status: Not on file  . Intimate partner violence:    Fear of current or ex partner: Not on file    Emotionally abused: Not on file    Physically abused: Not on file    Forced sexual activity: Not on file  Other Topics Concern  . Not on file  Social History Narrative  . Not on file    Review of Systems: See HPI, otherwise negative ROS  Physical Exam: BP (!) 141/76   Pulse 74   Temp 99 F (37.2  C) (Temporal)   Resp 16   Ht 5\' 10"  (1.778 m)   Wt 228 lb (103.4 kg)   SpO2 99%   BMI 32.71 kg/m  General:   Alert,  pleasant and cooperative in NAD Head:  Normocephalic and atraumatic. Neck:  Supple; no masses or thyromegaly. Lungs:  Clear throughout to auscultation.    Heart:  Regular rate and rhythm. Abdomen:  Soft, nontender and nondistended. Normal bowel sounds, without guarding, and without rebound.   Neurologic:  Alert and  oriented x4;  grossly normal neurologically.  Impression/Plan: [name] is here for an endoscopy and colonoscopy to be performed for regurgitation and colon cancer screening  Risks, benefits, limitations, and alternatives regarding  endoscopy and colonoscopy have been reviewed with the patient.  Questions have been answered.  All parties agreeable.   Sherri Sear, MD  08/25/2017, 8:07 AM

## 2017-08-25 NOTE — Op Note (Signed)
[facility] Gastroenterology Patient Name: James Rush Procedure Date: 08/25/2017 9:02 AM MRN: [id number] Account #: [id number] Date of Birth: [birth year] Admit Type: Outpatient Age: 57 Room: [facility] OR ROOM 01 Gender: Male Note Status: Finalized Procedure:            Colonoscopy Indications:          Screening for colorectal malignant neoplasm, This is                        the patient's first colonoscopy Providers:            Lin Landsman MD, MD Medicines:            Monitored Anesthesia Care Complications:        No immediate complications. Estimated blood loss:                        Minimal. Procedure:            Pre-Anesthesia Assessment:                       - Prior to the procedure, a History and Physical was                        performed, and patient medications and allergies were                        reviewed. The patient is competent. The risks and                        benefits of the procedure and the sedation options and                        risks were discussed with the patient. All questions                        were answered and informed consent was obtained.                        Patient identification and proposed procedure were                        verified by the physician, the nurse, the                        anesthesiologist, the anesthetist and the technician in                        the pre-procedure area in the procedure room in the                        endoscopy suite. Mental Status Examination: alert and                        oriented. Airway Examination: normal oropharyngeal                        airway and neck mobility. Respiratory Examination:                        clear to auscultation. CV  Examination: normal.                        Prophylactic Antibiotics: The patient does not require                        prophylactic antibiotics. Prior Anticoagulants: The                        patient has taken no  previous anticoagulant or                        antiplatelet agents. ASA Grade Assessment: II - A                        patient with mild systemic disease. After reviewing the                        risks and benefits, the patient was deemed in                        satisfactory condition to undergo the procedure. The                        anesthesia plan was to use monitored anesthesia care                        (MAC). Immediately prior to administration of                        medications, the patient was re-assessed for adequacy                        to receive sedatives. The heart rate, respiratory rate,                        oxygen saturations, blood pressure, adequacy of                        pulmonary ventilation, and response to care were                        monitored throughout the procedure. The physical status                        of the patient was re-assessed after the procedure.                       After obtaining informed consent, the colonoscope was                        passed under direct vision. Throughout the procedure,                        the patient's blood pressure, pulse, and oxygen                        saturations were monitored continuously. The was                        introduced through the anus  and advanced to the the                        cecum, identified by appendiceal orifice and ileocecal                        valve. The colonoscopy was performed without                        difficulty. The patient tolerated the procedure well.                        The quality of the bowel preparation was evaluated                        using the BBPS [facility] Bowel Preparation Scale) with                        scores of: Right Colon = 3, Transverse Colon = 3 and                        Left Colon = 3 (entire mucosa seen well with no                        residual staining, small fragments of stool or opaque                        liquid).  The total BBPS score equals 9. Findings:      The perianal and digital rectal examinations were normal. Pertinent       negatives include normal sphincter tone and no palpable rectal lesions.      A 4 mm polyp was found in the cecum. The polyp was sessile. The polyp       was removed with a cold biopsy forceps. Resection and retrieval were       complete.      A 5 mm polyp was found in the distal rectum, at the anal verge. The       polyp was sessile. The polyp was sampled with a cold biopsy forceps.       Polyp resection was incomplete. The resected tissue was retrieved.      Non-bleeding external internal hemorrhoids were found during       retroflexion. The hemorrhoids were large. Impression:           - One 4 mm polyp in the cecum, removed with a cold                        biopsy forceps. Resected and retrieved.                       - One 5 mm polyp in the rectum, removed with a cold                        biopsy forceps. Incomplete resection. Resected tissue                        retrieved.                       - Non-bleeding external internal  hemorrhoids. Recommendation:       - Discharge patient to home (with escort).                       - Resume previous diet today.                       - Continue present medications.                       - Await pathology results.                       - Repeat colonoscopy in 5-10 years for surveillance                        based on pathology results. Procedure Code(s):    --- Professional ---                       [phone], Colonoscopy, flexible; with biopsy, single or                        multiple Diagnosis Code(s):    --- Professional ---                       Z12.11, Encounter for screening for malignant neoplasm                        of colon                       D12.0, Benign neoplasm of cecum                       K62.1, Rectal polyp                       K64.4, Residual hemorrhoidal skin tags                       K64.8,  Other hemorrhoids CPT copyright 2017 American Medical Association. All rights reserved. The codes documented in this report are preliminary and upon coder review may  be revised to meet current compliance requirements. Dr. Ulyess Mort Lin Landsman MD, MD 08/25/2017 9:54:38 AM This report has been signed electronically. Number of Addenda: 0 Note Initiated On: 08/25/2017 9:02 AM Scope Withdrawal Time: 0 hours 18 minutes 36 seconds  Total Procedure Duration: 0 hours 20 minutes 10 seconds       [facility]

## 2017-08-26 ENCOUNTER — Encounter: Payer: Self-pay | Admitting: Gastroenterology

## 2017-09-06 ENCOUNTER — Telehealth: Payer: Self-pay | Admitting: Gastroenterology

## 2017-09-06 ENCOUNTER — Encounter: Payer: Self-pay | Admitting: Gastroenterology

## 2017-09-06 NOTE — Telephone Encounter (Signed)
Pt is calling for Biopsy results

## 2018-02-05 ENCOUNTER — Other Ambulatory Visit: Payer: Self-pay | Admitting: Family Medicine

## 2018-02-07 NOTE — Telephone Encounter (Signed)
Your patient 

## 2018-02-07 NOTE — Telephone Encounter (Signed)
Requested medication (s) are due for refill today: Yes  Requested medication (s) are on the active medication list: Yes  Last refill:  07/16/17 for all medications requested  Future visit scheduled: No  Notes to clinic:  Unable to refill per protocol, past due for 6 month follow up.     Requested Prescriptions  Pending Prescriptions Disp Refills   hydrochlorothiazide (HYDRODIURIL) 25 MG tablet [Pharmacy Med Name: HYDROCHLOROTHIAZIDE 25 MG TAB] 90 tablet 1    Sig: TAKE 1 TABLET BY MOUTH EVERY DAY     Cardiovascular: Diuretics - Thiazide Failed - 02/07/2018 10:24 AM      Failed - Valid encounter within last 6 months    Recent Outpatient Visits          6 months ago Essential hypertension   [place], Megan P, DO   1 year ago Essential hypertension   [place], Jeannette How, MD   1 year ago Need for tetanus booster   [facility] [place], Jeannette How, MD   2 years ago Redness of eye, left   [place], [place], [place]   3 years ago Immunization due   [place], MD      Future Appointments            In 1 week Stoioff, Ronda Fairly, MD [place] in normal range and within 360 days    Calcium  Date Value Ref Range Status  07/16/2017 9.8 8.7 - 10.2 mg/dL Final         Passed - Cr in normal range and within 360 days    Creatinine, Ser  Date Value Ref Range Status  07/16/2017 1.15 0.76 - 1.27 mg/dL Final         Passed - K in normal range and within 360 days    Potassium  Date Value Ref Range Status  07/16/2017 3.8 3.5 - 5.2 mmol/L Final         Passed - Na in normal range and within 360 days    Sodium  Date Value Ref Range Status  07/16/2017 139 134 - 144 mmol/L Final         Passed - Last BP in normal range    BP Readings from Last 1 Encounters:  08/25/17 137/89        amLODipine (NORVASC) 10 MG tablet  [Pharmacy Med Name: AMLODIPINE BESYLATE 10 MG TAB] 90 tablet 1    Sig: TAKE 1 TABLET BY MOUTH EVERY DAY     Cardiovascular:  Calcium Channel Blockers Failed - 02/07/2018 10:24 AM      Failed - Valid encounter within last 6 months    Recent Outpatient Visits          6 months ago Essential hypertension   [place], Megan P, DO   1 year ago Essential hypertension   [place], Jeannette How, MD   1 year ago Need for tetanus booster   [place], Jeannette How, MD   2 years ago Redness of eye, left   [place], [place], [place]   3 years ago Immunization due   [place], MD      Future Appointments            In 1 week Stoioff, Ronda Fairly, MD [facility]           Passed - Last BP in normal range    BP Readings from Last 1 Encounters:  08/25/17 137/89        losartan (COZAAR) 100 MG tablet [Pharmacy Med Name: LOSARTAN POTASSIUM 100 MG TAB] 90 tablet 1    Sig: TAKE 1 TABLET BY MOUTH EVERY DAY     Cardiovascular:  Angiotensin Receptor Blockers Failed - 02/07/2018 10:24 AM      Failed - Cr in normal range and within 180 days    Creatinine, Ser  Date Value Ref Range Status  07/16/2017 1.15 0.76 - 1.27 mg/dL Final         Failed - K in normal range and within 180 days    Potassium  Date Value Ref Range Status  07/16/2017 3.8 3.5 - 5.2 mmol/L Final         Failed - Valid encounter within last 6 months    Recent Outpatient Visits          6 months ago Essential hypertension   [place], Megan P, DO   1 year ago Essential hypertension   [facility] [place], Jeannette How, MD   1 year ago Need for tetanus booster   [facility], Jeannette How, MD   2 years ago Redness of eye, left   [place], [place], [place]   3 years ago Immunization due   [place], MD      Future Appointments            In 1 week Stoioff, Ronda Fairly, MD [place] - Patient is not pregnant      Passed - Last BP in normal range    BP Readings from Last 1 Encounters:  08/25/17 137/89

## 2018-02-07 NOTE — Telephone Encounter (Signed)
Patient called and discussed an appointment. 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 He says he will be in town the week of Christmas. I advised of the holiday schedule and he says he will call or come to the office to get worked in.

## 2018-02-18 ENCOUNTER — Ambulatory Visit
Admission: RE | Admit: 2018-02-18 | Discharge: 2018-02-18 | Disposition: A | Discharge status: Home / Self Care | Payer: BLUE CROSS/BLUE SHIELD | Source: Ambulatory Visit | Attending: Urology | Admitting: Urology

## 2018-02-18 ENCOUNTER — Encounter: Payer: Self-pay | Admitting: Urology

## 2018-02-18 ENCOUNTER — Ambulatory Visit (INDEPENDENT_AMBULATORY_CARE_PROVIDER_SITE_OTHER): Payer: BLUE CROSS/BLUE SHIELD | Admitting: Urology

## 2018-02-18 VITALS — BP 128/73 | HR 76 | Ht 70.0 in | Wt 230.9 lb

## 2018-02-18 DIAGNOSIS — Z87442 Personal history of urinary calculi: Secondary | ICD-10-CM | POA: Insufficient documentation

## 2018-02-18 DIAGNOSIS — N401 Enlarged prostate with lower urinary tract symptoms: Secondary | ICD-10-CM | POA: Diagnosis not present

## 2018-02-18 DIAGNOSIS — N5201 Erectile dysfunction due to arterial insufficiency: Secondary | ICD-10-CM

## 2018-02-18 DIAGNOSIS — N138 Other obstructive and reflux uropathy: Secondary | ICD-10-CM | POA: Diagnosis not present

## 2018-02-18 DIAGNOSIS — N200 Calculus of kidney: Secondary | ICD-10-CM | POA: Diagnosis not present

## 2018-02-18 LAB — BLADDER SCAN AMB NON-IMAGING: Scan Result: 41

## 2018-02-18 MED ORDER — SILDENAFIL CITRATE 20 MG PO TABS: ORAL_TABLET | ORAL | 0 refills | Status: DC

## 2018-02-18 NOTE — Progress Notes (Signed)
02/18/2018 10:21 AM   James Rush 17-Nov-1960 [id number]  Referring provider: Guadalupe Maple, MD [address] [place], [place] [address]  Chief Complaint  Patient presents with  . Nephrolithiasis   Urologic history: 1.  BPH with lower urinary tract symptoms  -TURP 04/2017  2.  Multiple bladder calculi  -Cystolitholapaxy 04/2017  3.  Nephrolithiasis  -Bilateral, small nonobstructing renal calculi   HPI: 57 year old male presents for follow-up.  He was last seen May 2019 and was doing well.  He continues to void with an excellent stream.  Denies dysuria or gross hematuria.  Denies flank, abdominal, pelvic or scrotal pain.  Prior CT did show a 2 mm left upper and right lower pole nonobstructing calculi.  He does complain today of difficulty achieving and maintaining an erection.  Denies pain or curvature.  Organic risk factors include hyperlipidemia, hypertension and antihypertensive medications.  Denies use of oral or sublingual nitrates.   PMH: Past Medical History:  Diagnosis Date  . Bladder calculi   . Herpes, genital   . History of kidney stones 05/2017  . Hyperlipidemia   . Hypertension    controlled  . Kidney stone     Surgical History: Past Surgical History:  Procedure Laterality Date  . COLONOSCOPY WITH PROPOFOL N/A 08/25/2017   Procedure: COLONOSCOPY WITH PROPOFOL;  Surgeon: Lin Landsman, MD;  Location: [place];  Service: Endoscopy;  Laterality: N/A;  . CYSTOSCOPY WITH LITHOLAPAXY N/A 05/28/2017   Procedure: CYSTOSCOPY WITH LITHOLAPAXY;  Surgeon: Abbie Sons, MD;  Location: [facility];  Service: Urology;  Laterality: N/A;  . ESOPHAGOGASTRODUODENOSCOPY (EGD) WITH PROPOFOL N/A 08/25/2017   Procedure: ESOPHAGOGASTRODUODENOSCOPY (EGD) WITH PROPOFOL;  Surgeon: Lin Landsman, MD;  Location: [place];  Service: Endoscopy;  Laterality: N/A;  . HEMORRHOID SURGERY  2012  . SEPTOPLASTY    . TRANSURETHRAL RESECTION OF PROSTATE  N/A 05/28/2017   Procedure: TRANSURETHRAL RESECTION OF THE PROSTATE (TURP);  Surgeon: Abbie Sons, MD;  Location: [facility];  Service: Urology;  Laterality: N/A;  . VASECTOMY Bilateral 1998    Home Medications:  Allergies as of 02/18/2018   No Known Allergies     Medication List       Accurate as of February 18, 2018 10:21 AM. Always use your most recent med list.        amLODipine 10 MG tablet Commonly known as:  NORVASC TAKE 1 TABLET BY MOUTH EVERY DAY   hydrochlorothiazide 25 MG tablet Commonly known as:  HYDRODIURIL TAKE 1 TABLET BY MOUTH EVERY DAY   losartan 100 MG tablet Commonly known as:  COZAAR TAKE 1 TABLET BY MOUTH EVERY DAY   valACYclovir 1000 MG tablet Commonly known as:  VALTREX Take 1 tablet (1,000 mg total) by mouth daily.       Allergies: No Known Allergies  Family History: Family History  Problem Relation Age of Onset  . Diabetes Mother   . Hypertension Mother   . Hypertension Father   . Hematuria Neg Hx   . Kidney cancer Neg Hx   . Kidney disease Neg Hx   . Prostate cancer Neg Hx   . Sickle cell trait Neg Hx   . Tuberculosis Neg Hx     Social History:  reports that he has never smoked. He has never used smokeless tobacco. He reports that he does not drink alcohol or use drugs.  ROS: UROLOGY Frequent Urination?: No Hard to postpone urination?: No Burning/pain with urination?: No Get  up at night to urinate?: No Leakage of urine?: No Urine stream starts and stops?: No Trouble starting stream?: No Do you have to strain to urinate?: No Blood in urine?: No Urinary tract infection?: No Sexually transmitted disease?: No Injury to kidneys or bladder?: No Painful intercourse?: No Weak stream?: No Erection problems?: No Penile pain?: No  Gastrointestinal Nausea?: No Vomiting?: No Indigestion/heartburn?: No Diarrhea?: No Constipation?: No  Constitutional Fever: No Night sweats?: No Weight loss?: No Fatigue?:  No  Skin Skin rash/lesions?: No Itching?: No  Eyes Blurred vision?: No Double vision?: No  Ears/Nose/Throat Sore throat?: No Sinus problems?: No  Hematologic/Lymphatic Swollen glands?: No Easy bruising?: No  Cardiovascular Leg swelling?: No Chest pain?: No  Respiratory Cough?: No Shortness of breath?: No  Endocrine Excessive thirst?: No  Musculoskeletal Back pain?: No Joint pain?: No  Neurological Headaches?: No Dizziness?: No  Psychologic Depression?: No Anxiety?: No  Physical Exam: BP 128/73 (BP Location: Left Arm, Patient Position: Sitting, Cuff Size: Large)   Pulse 76   Ht 5\' 10"  (1.778 m)   Wt 230 lb 14.4 oz (104.7 kg)   BMI 33.13 kg/m   Constitutional:  Alert and oriented, No acute distress. HEENT: [place] AT, moist mucus membranes.  Trachea midline, no masses. Cardiovascular: No clubbing, cyanosis, or edema. Respiratory: Normal respiratory effort, no increased work of breathing. GI: Abdomen is soft, nontender, nondistended, no abdominal masses GU: No CVA tenderness Lymph: No cervical or inguinal lymphadenopathy. Skin: No rashes, bruises or suspicious lesions. Neurologic: Grossly intact, no focal deficits, moving all 4 extremities. Psychiatric: Normal mood and affect.    Assessment & Plan:    1. BPH with obstruction/lower urinary tract symptoms Doing well status post TURP.  PVR by bladder scan today was 41 mL.  KUB today shows no evidence of recurrent bladder calculi.  2.  Nephrolithiasis KUB performed today was reviewed.  There is a small right lower pole renal calculus.  I do not see the left upper pole calculus on KUB.  3.  Erectile dysfunction New problem.  He was interested in a trial of generic sildenafil and an Rx was sent to his pharmacy.  Return in about 1 year (around 02/19/2019) for Lydia, KUB.   Abbie Sons, [place] [address] [place], [place] [address] [phone]

## 2018-04-07 ENCOUNTER — Encounter: Payer: Self-pay | Admitting: Family Medicine

## 2018-04-07 ENCOUNTER — Other Ambulatory Visit: Payer: Self-pay

## 2018-04-07 ENCOUNTER — Ambulatory Visit (INDEPENDENT_AMBULATORY_CARE_PROVIDER_SITE_OTHER): Payer: PRIVATE HEALTH INSURANCE | Admitting: Family Medicine

## 2018-04-07 VITALS — BP 124/83 | HR 82 | Temp 98.2°F | Ht 68.11 in | Wt 233.0 lb

## 2018-04-07 DIAGNOSIS — I10 Essential (primary) hypertension: Secondary | ICD-10-CM | POA: Diagnosis not present

## 2018-04-07 DIAGNOSIS — Z1329 Encounter for screening for other suspected endocrine disorder: Secondary | ICD-10-CM

## 2018-04-07 DIAGNOSIS — R7301 Impaired fasting glucose: Secondary | ICD-10-CM

## 2018-04-07 DIAGNOSIS — N401 Enlarged prostate with lower urinary tract symptoms: Secondary | ICD-10-CM

## 2018-04-07 DIAGNOSIS — N138 Other obstructive and reflux uropathy: Secondary | ICD-10-CM

## 2018-04-07 LAB — URINALYSIS, ROUTINE W REFLEX MICROSCOPIC
BILIRUBIN UA: NEGATIVE
Glucose, UA: NEGATIVE
KETONES UA: NEGATIVE
LEUKOCYTES UA: NEGATIVE
NITRITE UA: NEGATIVE
PROTEIN UA: NEGATIVE
RBC UA: NEGATIVE
Urobilinogen, Ur: 0.2 mg/dL (ref 0.2–1.0)
pH, UA: 5.5 (ref 5.0–7.5)

## 2018-04-07 MED ORDER — HYDROCHLOROTHIAZIDE 25 MG PO TABS: 25.0000 mg | ORAL_TABLET | Freq: Every day | ORAL | 4 refills | Status: DC

## 2018-04-07 MED ORDER — AMLODIPINE BESYLATE 10 MG PO TABS: 10.0000 mg | ORAL_TABLET | Freq: Every day | ORAL | 4 refills | Status: DC

## 2018-04-07 MED ORDER — LOSARTAN POTASSIUM 100 MG PO TABS: 100.0000 mg | ORAL_TABLET | Freq: Every day | ORAL | 4 refills | Status: DC

## 2018-04-07 MED ORDER — VALACYCLOVIR HCL 1 G PO TABS: 1000.0000 mg | ORAL_TABLET | Freq: Every day | ORAL | 1 refills | Status: AC

## 2018-04-07 NOTE — Assessment & Plan Note (Signed)
The current medical regimen is effective;  continue present plan and medications.  

## 2018-04-07 NOTE — Assessment & Plan Note (Signed)
Working on wt

## 2018-04-07 NOTE — Progress Notes (Signed)
BP 124/83   Pulse 82   Temp 98.2 F (36.8 C) (Oral)   Ht 5' 8.11" (1.73 m)   Wt 233 lb (105.7 kg)   SpO2 94%   BMI 35.31 kg/m    Subjective:    Patient ID: James Rush, male    DOB: 02-07-1961, 58 y.o.   MRN: [id number]  HPI: James Rush is a 58 y.o. male  Chief Complaint  Patient presents with  . Annual Exam  . Medication Refill  Patient's had kidney stone surgery and colonoscopy in the recent past which is reported is normal. Taking blood pressure medication without problems or issues. Uses Valtrex as needed and works well.  Relevant past medical, surgical, family and social history reviewed and updated as indicated. Interim medical history since our last visit reviewed. Allergies and medications reviewed and updated.  Review of Systems  Constitutional: Negative.   HENT: Negative.   Eyes: Negative.   Respiratory: Negative.   Cardiovascular: Negative.   Gastrointestinal: Negative.   Endocrine: Negative.   Genitourinary: Negative.   Musculoskeletal: Negative.   Skin: Negative.   Allergic/Immunologic: Negative.   Neurological: Negative.   Hematological: Negative.   Psychiatric/Behavioral: Negative.     Per HPI unless specifically indicated above     Objective:    BP 124/83   Pulse 82   Temp 98.2 F (36.8 C) (Oral)   Ht 5' 8.11" (1.73 m)   Wt 233 lb (105.7 kg)   SpO2 94%   BMI 35.31 kg/m   Wt Readings from Last 3 Encounters:  04/07/18 233 lb (105.7 kg)  02/18/18 230 lb 14.4 oz (104.7 kg)  08/25/17 228 lb (103.4 kg)    Physical Exam Constitutional:      Appearance: He is well-developed.  HENT:     Head: Normocephalic.     Right Ear: External ear normal.     Left Ear: External ear normal.     Nose: Nose normal.  Eyes:     Conjunctiva/sclera: Conjunctivae normal.     Pupils: Pupils are equal, round, and reactive to light.  Neck:     Musculoskeletal: Normal range of motion and neck supple.     Thyroid: No thyromegaly.    Cardiovascular:     Rate and Rhythm: Normal rate and regular rhythm.     Heart sounds: Normal heart sounds.  Pulmonary:     Effort: Pulmonary effort is normal.     Breath sounds: Normal breath sounds.  Abdominal:     General: Bowel sounds are normal.     Palpations: Abdomen is soft. There is no hepatomegaly or splenomegaly.  Genitourinary:    Comments: Recently done with colonoscopy Musculoskeletal: Normal range of motion.  Lymphadenopathy:     Cervical: No cervical adenopathy.  Skin:    General: Skin is warm and dry.  Neurological:     Mental Status: He is alert and oriented to person, place, and time.     Deep Tendon Reflexes: Reflexes are normal and symmetric.  Psychiatric:        Behavior: Behavior normal.        Thought Content: Thought content normal.        Judgment: Judgment normal.     Results for orders placed or performed in visit on 02/18/18  Bladder Scan (Post Void Residual) in office  Result Value Ref Range   Scan Result 41 ml       Assessment & Plan:   Problem List Items Addressed This Visit  Cardiovascular and Mediastinum   Essential hypertension - Primary    The current medical regimen is effective;  continue present plan and medications.       Relevant Medications   losartan (COZAAR) 100 MG tablet   hydrochlorothiazide (HYDRODIURIL) 25 MG tablet   amLODipine (NORVASC) 10 MG tablet   Other Relevant Orders   CBC with Differential/Platelet   Comprehensive metabolic panel   Lipid panel   Urinalysis, Routine w reflex microscopic     Endocrine   Impaired fasting glucose   Relevant Orders   CBC with Differential/Platelet   Comprehensive metabolic panel   Lipid panel   Urinalysis, Routine w reflex microscopic     Genitourinary   BPH with obstruction/lower urinary tract symptoms   Relevant Orders   PSA    Other Visit Diagnoses    Thyroid disorder screen       Relevant Orders   TSH       Follow up plan: Return in about 6 months  (around 10/06/2018) for BMP.

## 2018-04-08 LAB — CBC WITH DIFFERENTIAL/PLATELET
BASOS: 1 %
Basophils Absolute: 0 10*3/uL (ref 0.0–0.2)
EOS (ABSOLUTE): 0.1 10*3/uL (ref 0.0–0.4)
EOS: 2 %
HEMATOCRIT: 47.4 % (ref 37.5–51.0)
HEMOGLOBIN: 16.4 g/dL (ref 13.0–17.7)
Immature Grans (Abs): 0 10*3/uL (ref 0.0–0.1)
Immature Granulocytes: 1 %
Lymphocytes Absolute: 1.9 10*3/uL (ref 0.7–3.1)
Lymphs: 31 %
MCH: 30.7 pg (ref 26.6–33.0)
MCHC: 34.6 g/dL (ref 31.5–35.7)
MCV: 89 fL (ref 79–97)
MONOCYTES: 9 %
MONOS ABS: 0.5 10*3/uL (ref 0.1–0.9)
Neutrophils Absolute: 3.5 10*3/uL (ref 1.4–7.0)
Neutrophils: 56 %
Platelets: 220 10*3/uL (ref 150–450)
RBC: 5.34 x10E6/uL (ref 4.14–5.80)
RDW: 12.7 % (ref 11.6–15.4)
WBC: 6.1 10*3/uL (ref 3.4–10.8)

## 2018-04-08 LAB — COMPREHENSIVE METABOLIC PANEL
A/G RATIO: 1.9 (ref 1.2–2.2)
ALBUMIN: 4.7 g/dL (ref 3.8–4.9)
ALT: 22 IU/L (ref 0–44)
AST: 22 IU/L (ref 0–40)
Alkaline Phosphatase: 68 IU/L (ref 39–117)
BUN / CREAT RATIO: 16 (ref 9–20)
BUN: 17 mg/dL (ref 6–24)
Bilirubin Total: 0.8 mg/dL (ref 0.0–1.2)
CO2: 24 mmol/L (ref 20–29)
Calcium: 9.9 mg/dL (ref 8.7–10.2)
Chloride: 101 mmol/L (ref 96–106)
Creatinine, Ser: 1.08 mg/dL (ref 0.76–1.27)
GFR calc Af Amer: 87 mL/min/{1.73_m2} (ref >59)
GFR, EST NON AFRICAN AMERICAN: 75 mL/min/{1.73_m2} (ref >59)
GLOBULIN, TOTAL: 2.5 g/dL (ref 1.5–4.5)
Glucose: 110 mg/dL — ABNORMAL HIGH (ref 65–99)
POTASSIUM: 3.9 mmol/L (ref 3.5–5.2)
SODIUM: 141 mmol/L (ref 134–144)
Total Protein: 7.2 g/dL (ref 6.0–8.5)

## 2018-04-08 LAB — LIPID PANEL
CHOL/HDL RATIO: 2.9 ratio (ref 0.0–5.0)
Cholesterol, Total: 127 mg/dL (ref 100–199)
HDL: 44 mg/dL (ref >39)
LDL CALC: 55 mg/dL (ref 0–99)
Triglycerides: 139 mg/dL (ref 0–149)
VLDL Cholesterol Cal: 28 mg/dL (ref 5–40)

## 2018-04-08 LAB — TSH: TSH: 1.95 u[IU]/mL (ref 0.450–4.500)

## 2018-04-08 LAB — PSA: PROSTATE SPECIFIC AG, SERUM: 0.6 ng/mL (ref 0.0–4.0)

## 2018-04-11 ENCOUNTER — Encounter: Payer: Self-pay | Admitting: Family Medicine

## 2018-06-10 ENCOUNTER — Telehealth: Payer: Self-pay | Admitting: Family Medicine

## 2018-06-10 NOTE — Telephone Encounter (Signed)
Spoke with patient. Gave him the address to [facility]

## 2018-06-10 NOTE — Telephone Encounter (Signed)
Can go to [facility].

## 2018-06-10 NOTE — Telephone Encounter (Signed)
Pt. Thinks he has had mild symptoms of COVID 19. Cough, no sense of taste and headache. Felt like he had a fever, but does not have a thermometer. Instructed [facility] is not testing currently. Feels better, but wants to know where he could go to be tested. Would like to know what Dr.Crissman thinks.

## 2018-06-21 ENCOUNTER — Telehealth: Payer: Self-pay

## 2018-06-21 MED ORDER — SILDENAFIL CITRATE 20 MG PO TABS: ORAL_TABLET | ORAL | 0 refills | Status: AC

## 2018-06-21 NOTE — Telephone Encounter (Signed)
Sildenafil refill sent to [facility]

## 2018-06-27 ENCOUNTER — Telehealth: Payer: Self-pay | Admitting: Family Medicine

## 2018-06-27 NOTE — Telephone Encounter (Signed)
Please advise. Financial form was signed by pt under media.

## 2018-06-27 NOTE — Telephone Encounter (Signed)
Copied from [place] [id number]. Topic: General - Other >> Jun 27, 2018 11:18 AM Pauline Good wrote: Reason for CRM: for visit 2.6.20 pt received a bill and stated he talked to someone in billing and they stated the visit may be coded wrong. Please advse

## 2018-07-05 NOTE — Telephone Encounter (Signed)
Pt called back about this bill from 04/07/2018 . Pt states the insurance company says it is coded wrong, so they will not pay for it. It was supposed to be coded as a physical. Please advise.

## 2019-01-05 IMAGING — CT CT ABD-PEL WO/W CM
3 of 12 series · 11 of 46 positions shown, 17 images · IV contrast (iopamidol)
Comparison: None.

CLINICAL DATA: Hematuria twice 2 months ago. Right abdominal
swelling previously. Early satiety.

EXAM:
CT ABDOMEN AND PELVIS WITHOUT AND WITH CONTRAST
TECHNIQUE: Multidetector CT imaging of the abdomen and pelvis was performed
following the standard protocol before and following the bolus
administration of intravenous contrast.
CONTRAST:  125mL RTEWC9-8SS IOPAMIDOL (RTEWC9-8SS) INJECTION 61%

[Series 10: without pre 5.00 br36 s3 axial without · axial · non-contrast · 0.79mm/px · z∈[-1573,-1198]mm · 6 of 106 slices shown, 11 images]
[im 16/106  soft-tissue]
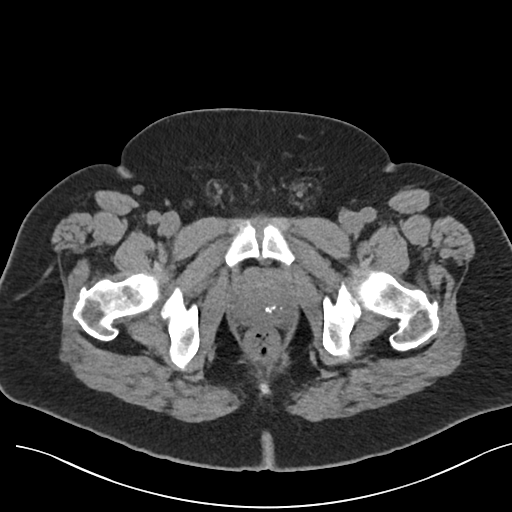
[im 16/106  bone]
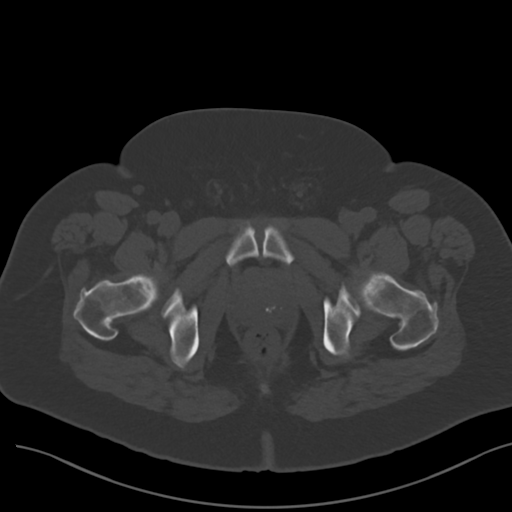
[im 31/106  soft-tissue]
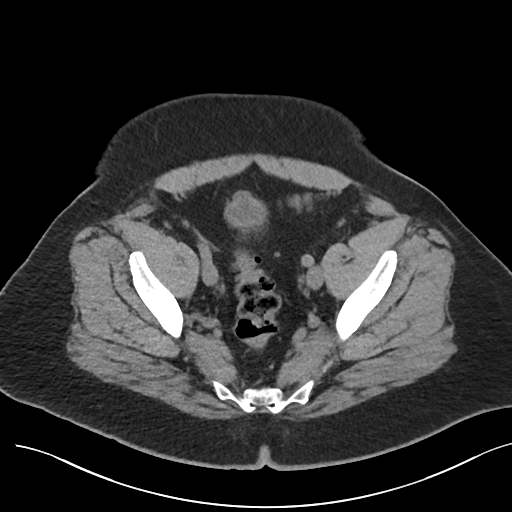
[im 46/106  soft-tissue]
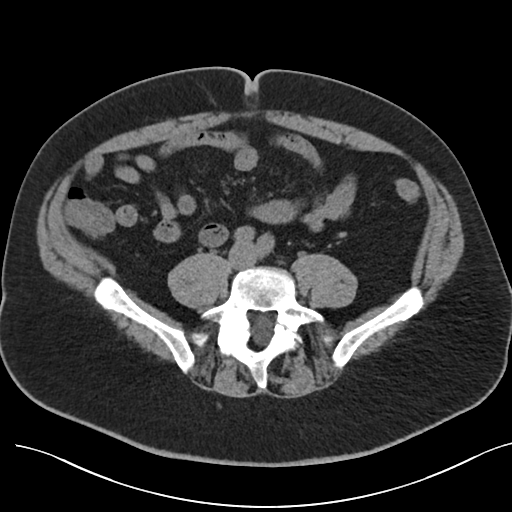
[im 46/106  lung]
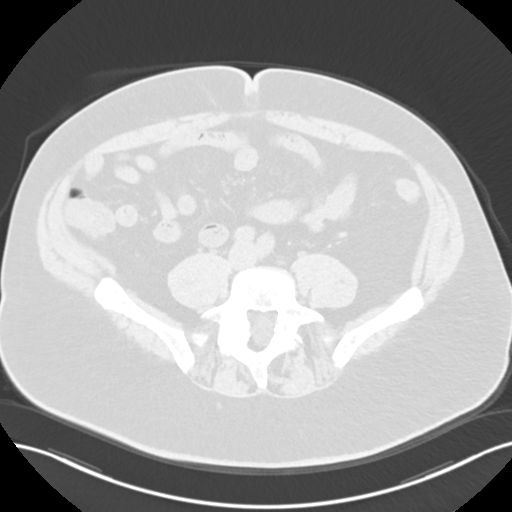
[im 61/106  soft-tissue]
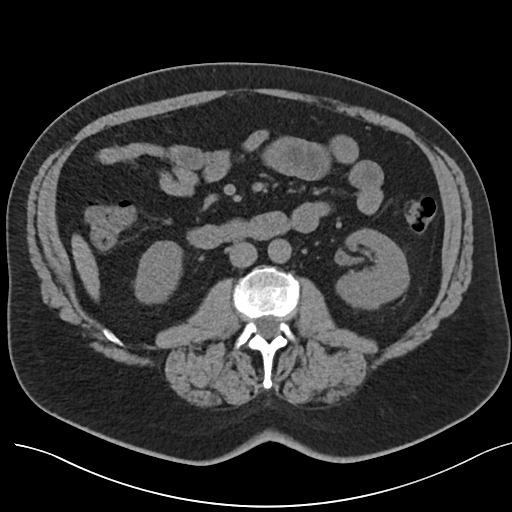
[im 61/106  lung]
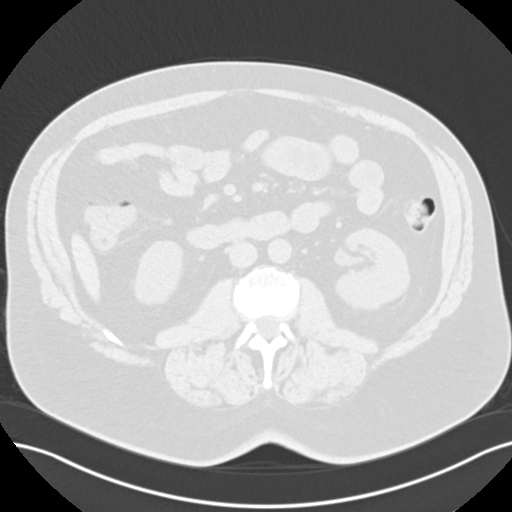
[im 76/106  soft-tissue]
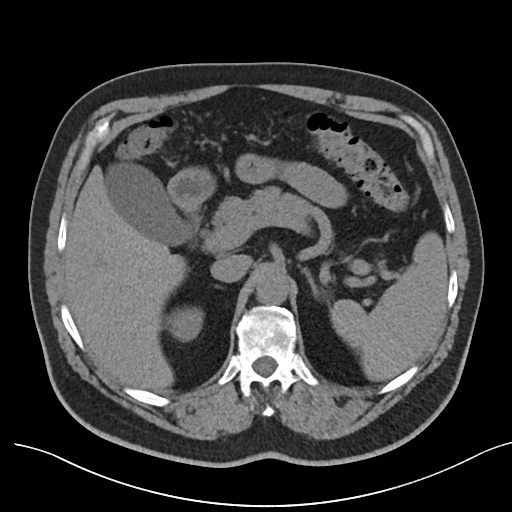
[im 76/106  lung]
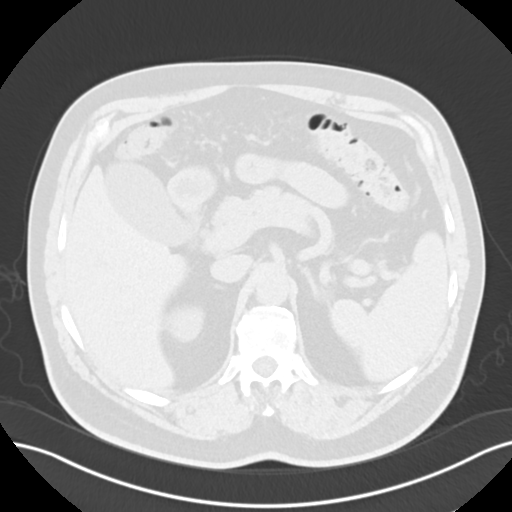
[im 91/106  soft-tissue]
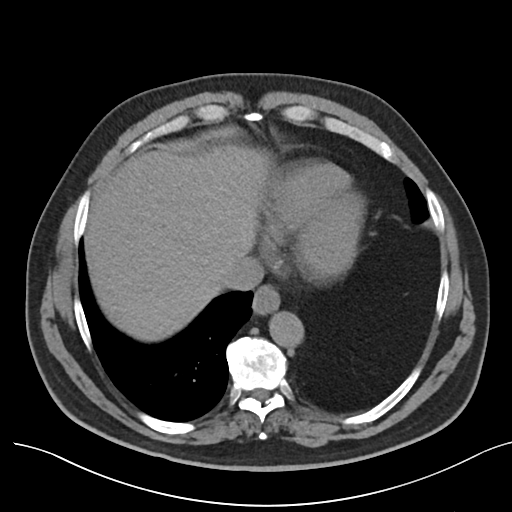
[im 91/106  lung]
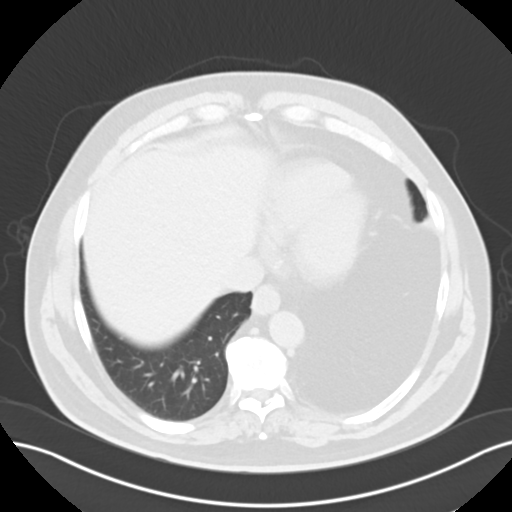

[Series 11: hematuria with 5.00 br36 s3 axial with · axial · 0.79mm/px · z∈[-1568,-1418]mm · 3 of 104 slices shown]
[im 15/104  soft-tissue]
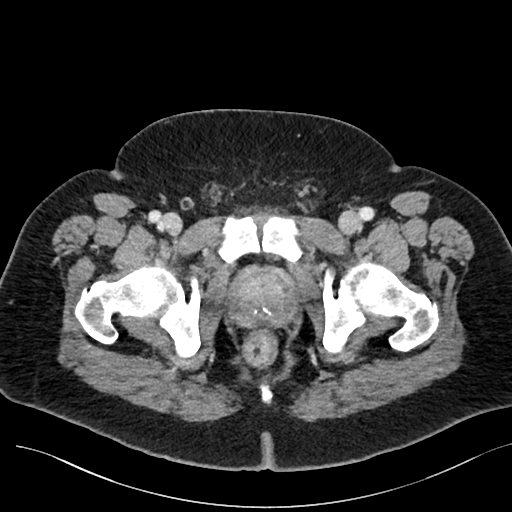
[im 30/104  soft-tissue]
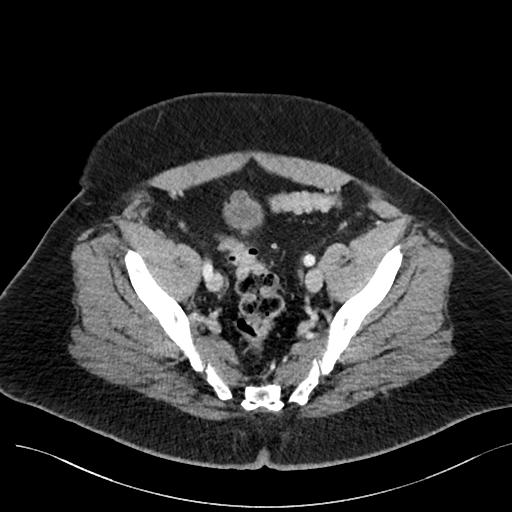
[im 45/104  soft-tissue]
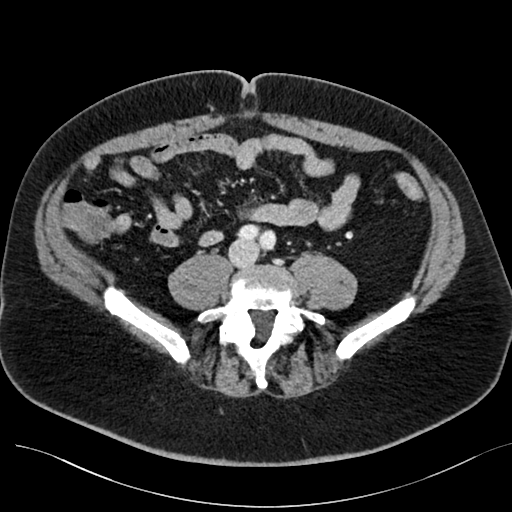

[Series 22: without pre 2.00 br36 s3 cor cor without · coronal · non-contrast · 0.79mm/px · 2 of 202 slices shown, 3 images]
[im 68/202  soft-tissue]
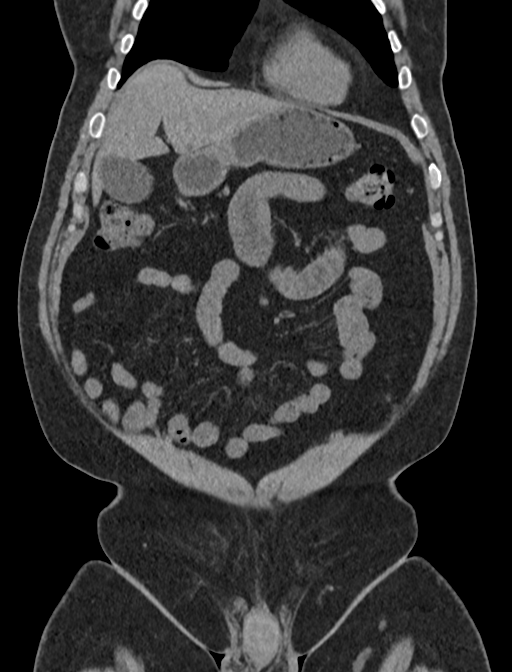
[im 68/202  bone]
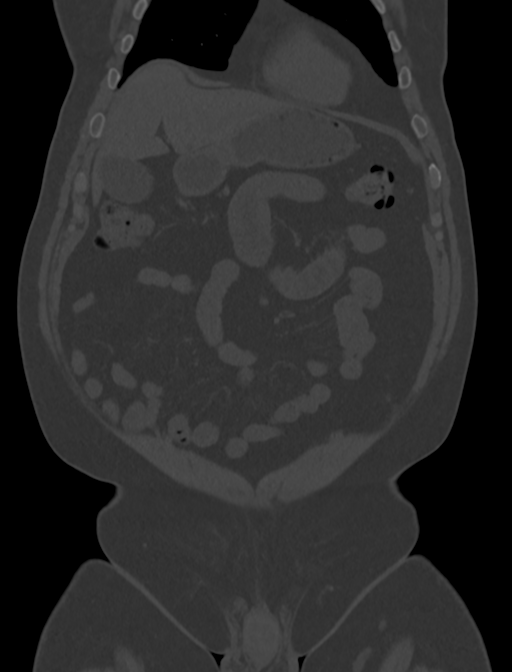
[im 135/202  soft-tissue]
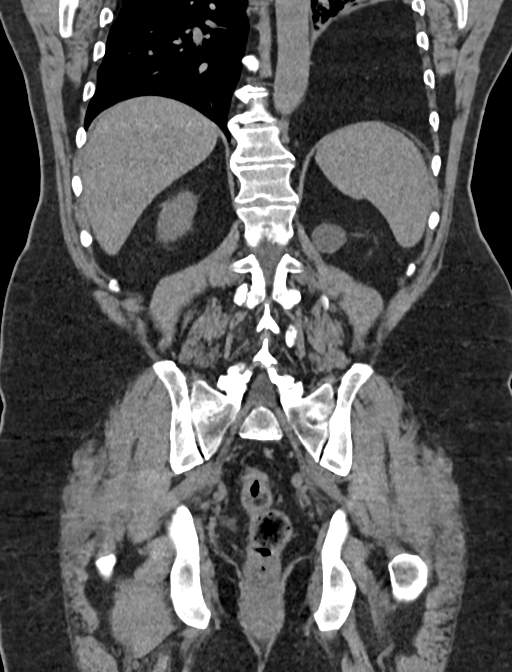

[11 of 46 positions shown; findings below may reference images not displayed]

FINDINGS: Lower chest: Abnormal accentuated epicardial or pleural adipose
tissue along the left lung base above the diaphragm measuring
approximately 21.9 by 11.3 by 8.3 cm. This does not appear to be
exerting a significant mass effect on the left heart.

Hepatobiliary: 5 mm hypodense lesion in the dome of the liver on
image [DATE] otherwise unremarkable.

Pancreas: Unremarkable

Spleen: Unremarkable

Adrenals/Urinary Tract: Adrenal glands normal.

Exophytic 3.1 cm Bosniak category 1 cyst of the left kidney upper
pole.

2 mm left kidney upper pole nonobstructive renal calculus on image
117/22. 2 mm right kidney lower pole nonobstructive renal calculus,
image 106/22.

A somewhat unusual 1.7 by 1.1 cm focus of enhancing tissue along the
right renal hilum is shown, but appears to enhance identically to
the adjacent renal parenchyma, and accordingly is thought to
probably simply represent an unusual extension of the renal
parenchyma.

Four bladder calculi are settled dependently in the urinary bladder
and measure up to 1.7 cm in diameter. Several bladder cellules are
present.

Stomach/Bowel: Small periampullary duodenal diverticulum. Mild
sigmoid colon diverticulosis.

Vascular/Lymphatic: Unremarkable

Reproductive: The prostate gland measures 5.9 by 4.8 by 5.9 cm
(volume = 87 cm^3) and contains scattered calcifications
internally.

Other: No supplemental non-categorized findings.

Musculoskeletal: Chronic bilateral pars defects at L5 with 6 mm of
anterolisthesis and resulting left greater than right foraminal
impingement at the L5-S1 level.

Small umbilical hernia contains adipose tissue.
IMPRESSION: 1. Multiple bladder calculi measuring up to 1.7 cm in diameter.
There are also single small bilateral nonobstructive renal calculi.
No other cause for hematuria identified.
2. Exophytic simple cyst of the left kidney upper pole.
3. Moderate prostatomegaly.
4. Unusual fatty mass above the left hemidiaphragm, probably
representing a variant of epicardial lipomatosis. This does not
currently appear to be indenting the left cardiac margin.
5. Other imaging findings of potential clinical significance:
Chronic bilateral pars defects at L5 with 6 mm anterolisthesis and
resulting bilateral foraminal impingement. Small umbilical hernia
contains adipose tissue. Mild sigmoid colon diverticulosis. Small
periampullary duodenal diverticulum.

## 2019-02-20 ENCOUNTER — Ambulatory Visit: Payer: PRIVATE HEALTH INSURANCE | Admitting: Urology

## 2019-02-22 ENCOUNTER — Ambulatory Visit: Payer: BLUE CROSS/BLUE SHIELD | Admitting: Urology

## 2019-07-05 NOTE — Telephone Encounter (Signed)
Pt called and is requesting to have information about this bill. Please advise,

## 2019-07-06 NOTE — Telephone Encounter (Signed)
Spoke with patient regarding charges for bill in 2020.  Pt states that he will be coming in for his CPE on Friday, 07/14/19 and wants to be sure he is not charged an additional office visit.

## 2019-07-14 ENCOUNTER — Ambulatory Visit (INDEPENDENT_AMBULATORY_CARE_PROVIDER_SITE_OTHER): Payer: PRIVATE HEALTH INSURANCE | Admitting: Nurse Practitioner

## 2019-07-14 ENCOUNTER — Encounter: Payer: Self-pay | Admitting: Family Medicine

## 2019-07-14 ENCOUNTER — Other Ambulatory Visit: Payer: Self-pay

## 2019-07-14 VITALS — BP 129/82 | HR 69 | Temp 98.5°F | Ht 68.5 in | Wt 236.2 lb

## 2019-07-14 DIAGNOSIS — Z0000 Encounter for general adult medical examination without abnormal findings: Secondary | ICD-10-CM | POA: Diagnosis not present

## 2019-07-14 DIAGNOSIS — I10 Essential (primary) hypertension: Secondary | ICD-10-CM

## 2019-07-14 DIAGNOSIS — R7301 Impaired fasting glucose: Secondary | ICD-10-CM | POA: Diagnosis not present

## 2019-07-14 MED ORDER — HYDROCHLOROTHIAZIDE 25 MG PO TABS: 25.0000 mg | ORAL_TABLET | Freq: Every day | ORAL | 3 refills | Status: AC

## 2019-07-14 MED ORDER — LOSARTAN POTASSIUM 100 MG PO TABS: 100.0000 mg | ORAL_TABLET | Freq: Every day | ORAL | 3 refills | Status: AC

## 2019-07-14 MED ORDER — AMLODIPINE BESYLATE 10 MG PO TABS: 10.0000 mg | ORAL_TABLET | Freq: Every day | ORAL | 3 refills | Status: AC

## 2019-07-14 NOTE — Assessment & Plan Note (Signed)
Chronic, stable.  BP at goal in office today.  Continue to monitor BP at home and continue current regimen of amlodipine 10 mg, losartan 100 mg, and HCTZ 25 mg daily.  CBC, CMP checked today.

## 2019-07-14 NOTE — Patient Instructions (Addendum)
Nice meeting you today, James Rush.  We will call you with your labs on Monday.  If you have any questions or concerns, please do not hesitate to call.  Take care, James Rush  Preventive Care 60-60 Years Old, Male Preventive care refers to lifestyle choices and visits with your health care provider that can promote health and wellness. This includes:  A yearly physical exam. This is also called an annual well check.  Regular dental and eye exams.  Immunizations.  Screening for certain conditions.  Healthy lifestyle choices, such as eating a healthy diet, getting regular exercise, not using drugs or products that contain nicotine and tobacco, and limiting alcohol use. What can I expect for my preventive care visit? Physical exam Your health care provider will check:  Height and weight. These may be used to calculate body mass index (BMI), which is a measurement that tells if you are at a healthy weight.  Heart rate and blood pressure.  Your skin for abnormal spots. Counseling Your health care provider may ask you questions about:  Alcohol, tobacco, and drug use.  Emotional well-being.  Home and relationship well-being.  Sexual activity.  Eating habits.  Work and work Statistician. What immunizations do I need?  Influenza (flu) vaccine  This is recommended every year. Tetanus, diphtheria, and pertussis (Tdap) vaccine  You may need a Td booster every 10 years. Varicella (chickenpox) vaccine  You may need this vaccine if you have not already been vaccinated. Zoster (shingles) vaccine  You may need this after age 80. Measles, mumps, and rubella (MMR) vaccine  You may need at least one dose of MMR if you were born in 1957 or later. You may also need a second dose. Pneumococcal conjugate (PCV13) vaccine  You may need this if you have certain conditions and were not previously vaccinated. Pneumococcal polysaccharide (PPSV23) vaccine  You may need one or two doses if you  smoke cigarettes or if you have certain conditions. Meningococcal conjugate (MenACWY) vaccine  You may need this if you have certain conditions. Hepatitis A vaccine  You may need this if you have certain conditions or if you travel or work in places where you may be exposed to hepatitis A. Hepatitis B vaccine  You may need this if you have certain conditions or if you travel or work in places where you may be exposed to hepatitis B. Haemophilus influenzae type b (Hib) vaccine  You may need this if you have certain risk factors. Human papillomavirus (HPV) vaccine  If recommended by your health care provider, you may need three doses over 6 months. You may receive vaccines as individual doses or as more than one vaccine together in one shot (combination vaccines). Talk with your health care provider about the risks and benefits of combination vaccines. What tests do I need? Blood tests  Lipid and cholesterol levels. These may be checked every 5 years, or more frequently if you are over 60 years old.  Hepatitis C test.  Hepatitis B test. Screening  Lung cancer screening. You may have this screening every year starting at age 60 if you have a 30-pack-year history of smoking and currently smoke or have quit within the past 15 years.  Prostate cancer screening. Recommendations will vary depending on your family history and other risks.  Colorectal cancer screening. All adults should have this screening starting at age 60 and continuing until age 74 until age 74. Your health care provider may recommend screening at age 60 if you are at increased risk.  You will have tests every 1-10 years, depending on your results and the type of screening test.  Diabetes screening. This is done by checking your blood sugar (glucose) after you have not eaten for a while (fasting). You may have this done every 1-3 years.  Sexually transmitted disease (STD) testing. Follow these instructions at home: Eating and  drinking  Eat a diet that includes fresh fruits and vegetables, whole grains, lean protein, and low-fat dairy products.  Take vitamin and mineral supplements as recommended by your health care provider.  Do not drink alcohol if your health care provider tells you not to drink.  If you drink alcohol: ? Limit how much you have to 0-2 drinks a day. ? Be aware of how much alcohol is in your drink. In the [place] one drink equals one 12 oz bottle of beer (355 mL), one 5 oz glass of wine (148 mL), or one 1 oz glass of hard liquor (44 mL). Lifestyle  Take daily care of your teeth and gums.  Stay active. Exercise for at least 30 minutes on 5 or more days each week.  Do not use any products that contain nicotine or tobacco, such as cigarettes, e-cigarettes, and chewing tobacco. If you need help quitting, ask your health care provider.  If you are sexually active, practice safe sex. Use a condom or other form of protection to prevent STIs (sexually transmitted infections).  Talk with your health care provider about taking a low-dose aspirin every day starting at age 60. What's next?  Go to your health care provider once a year for a well check visit.  Ask your health care provider how often you should have your eyes and teeth checked.  Stay up to date on all vaccines. This information is not intended to replace advice given to you by your health care provider. Make sure you discuss any questions you have with your health care provider. Document Revised: 02/10/2018 Document Reviewed: 02/10/2018 Elsevier Patient Education  2020 [employer] Inc[employer]  

## 2019-07-14 NOTE — Progress Notes (Signed)
BP 129/82   Pulse 69   Temp 98.5 F (36.9 C)   Ht 5' 8.5" (1.74 m)   Wt 236 lb 4 oz (107.2 kg)   SpO2 98%   BMI 35.40 kg/m    Subjective:    Patient ID: James Rush, male    DOB: 02-Sep-1960, 59 y.o.   MRN: [id number]  HPI: [name] is a 59 y.o. male presenting on 07/14/2019 for comprehensive medical examination. Current medical complaints include:  HYPERTENSION Patient is taking amlodipine 10 mg, HCTZ 25, and losartan 100 mg.  Hypertension status: controlled  Satisfied with current treatment? yes Duration of hypertension: chronic BP monitoring frequency:  a few times a day BP range: 110s/60-80s BP medication side effects:  no Medication compliance: excellent compliance Aspirin: no Recurrent headaches: no Visual changes: no Palpitations: no Dyspnea: no Chest pain: no Lower extremity edema: no Dizzy/lightheaded: no  He currently lives with: self Interim Problems from his last visit: none  Depression Screen done today and results listed below:  Depression screen [facility] 2/9 07/14/2019 01/18/2017  Decreased Interest 0 0  Down, Depressed, Hopeless 0 0  PHQ - 2 Score 0 0   The patient does not have a history of falls. I did not complete a risk assessment for falls. A plan of care for falls was not documented.  Past Medical History:  Past Medical History:  Diagnosis Date  . Bladder calculi   . Herpes, genital   . History of kidney stones 05/2017  . Hyperlipidemia   . Hypertension    controlled  . Kidney stone    Surgical History:  Past Surgical History:  Procedure Laterality Date  . COLONOSCOPY WITH PROPOFOL N/A 08/25/2017   Procedure: COLONOSCOPY WITH PROPOFOL;  Surgeon: Lin Landsman, MD;  Location: [place];  Service: Endoscopy;  Laterality: N/A;  . CYSTOSCOPY WITH LITHOLAPAXY N/A 05/28/2017   Procedure: CYSTOSCOPY WITH LITHOLAPAXY;  Surgeon: Abbie Sons, MD;  Location: [facility];  Service: Urology;  Laterality: N/A;  .  ESOPHAGOGASTRODUODENOSCOPY (EGD) WITH PROPOFOL N/A 08/25/2017   Procedure: ESOPHAGOGASTRODUODENOSCOPY (EGD) WITH PROPOFOL;  Surgeon: Lin Landsman, MD;  Location: [place];  Service: Endoscopy;  Laterality: N/A;  . HEMORRHOID SURGERY  2012  . SEPTOPLASTY    . TRANSURETHRAL RESECTION OF PROSTATE N/A 05/28/2017   Procedure: TRANSURETHRAL RESECTION OF THE PROSTATE (TURP);  Surgeon: Abbie Sons, MD;  Location: [facility];  Service: Urology;  Laterality: N/A;  . VASECTOMY Bilateral 1998    Medications:  Current Outpatient Medications on File Prior to Visit  Medication Sig  . sildenafil (REVATIO) 20 MG tablet 2-5 tabs 1 hour prior to intercourse  . valACYclovir (VALTREX) 1000 MG tablet Take 1 tablet (1,000 mg total) by mouth daily.   No current facility-administered medications on file prior to visit.    Allergies:  No Known Allergies  Social History:  Social History   Socioeconomic History  . Marital status: Divorced    Spouse name: Not on file  . Number of children: Not on file  . Years of education: Not on file  . Highest education level: Not on file  Occupational History  . Not on file  Tobacco Use  . Smoking status: Never Smoker  . Smokeless tobacco: Never Used  Substance and Sexual Activity  . Alcohol use: No  . Drug use: No  . Sexual activity: Not on file  Other Topics Concern  . Not on file  Social History Narrative  .  Not on file   Social Determinants of Health   Financial Resource Strain:   . Difficulty of Paying Living Expenses:   Food Insecurity:   . Worried About Charity fundraiser in the Last Year:   . Arboriculturist in the Last Year:   Transportation Needs:   . Film/video editor (Medical):   Marland Kitchen Lack of Transportation (Non-Medical):   Physical Activity:   . Days of Exercise per Week:   . Minutes of Exercise per Session:   Stress:   . Feeling of Stress :   Social Connections:   . Frequency of Communication with Friends and  Family:   . Frequency of Social Gatherings with Friends and Family:   . Attends Religious Services:   . Active Member of Clubs or Organizations:   . Attends Archivist Meetings:   Marland Kitchen Marital Status:   Intimate Partner Violence:   . Fear of Current or Ex-Partner:   . Emotionally Abused:   Marland Kitchen Physically Abused:   . Sexually Abused:    Social History   Tobacco Use  Smoking Status Never Smoker  Smokeless Tobacco Never Used   Social History   Substance and Sexual Activity  Alcohol Use No    Family History:  Family History  Problem Relation Age of Onset  . Diabetes Mother   . Hypertension Mother   . Hypertension Father   . Hematuria Neg Hx   . Kidney cancer Neg Hx   . Kidney disease Neg Hx   . Prostate cancer Neg Hx   . Sickle cell trait Neg Hx   . Tuberculosis Neg Hx    Past medical history, surgical history, medications, allergies, family history and social history reviewed with patient today and changes made to appropriate areas of the chart.   Review of Systems  Constitutional: Negative.  Negative for diaphoresis, fever and weight loss.  HENT: Negative.  Negative for congestion, hearing loss, sinus pain, sore throat and tinnitus.   Eyes: Negative.  Negative for blurred vision and double vision.  Respiratory: Negative.  Negative for cough, sputum production and wheezing.   Cardiovascular: Negative.  Negative for chest pain, palpitations and leg swelling.  Gastrointestinal: Negative.  Negative for abdominal pain, blood in stool, constipation, diarrhea, nausea and vomiting.  Genitourinary: Positive for urgency (at night). Negative for dysuria, frequency and hematuria.  Musculoskeletal: Negative.  Negative for back pain, myalgias and neck pain.  Skin: Negative.  Negative for itching and rash.  Neurological: Negative.  Negative for dizziness, tingling, weakness and headaches.  Psychiatric/Behavioral: Negative.  Negative for depression. The patient is not  nervous/anxious and does not have insomnia.    All other ROS negative except what is listed above and in the HPI.      Objective:    BP 129/82   Pulse 69   Temp 98.5 F (36.9 C)   Ht 5' 8.5" (1.74 m)   Wt 236 lb 4 oz (107.2 kg)   SpO2 98%   BMI 35.40 kg/m   Wt Readings from Last 3 Encounters:  07/14/19 236 lb 4 oz (107.2 kg)  04/07/18 233 lb (105.7 kg)  02/18/18 230 lb 14.4 oz (104.7 kg)    Physical Exam Vitals and nursing note reviewed.  Constitutional:      General: He is not in acute distress.    Appearance: Normal appearance.  HENT:     Head: Normocephalic.     Right Ear: Tympanic membrane, ear canal and external ear  normal.     Left Ear: Tympanic membrane, ear canal and external ear normal.     Nose: Nose normal. No congestion.     Mouth/Throat:     Mouth: Mucous membranes are moist.     Pharynx: Oropharynx is clear. No oropharyngeal exudate or posterior oropharyngeal erythema.  Eyes:     General: No scleral icterus.    Extraocular Movements: Extraocular movements intact.     Pupils: Pupils are equal, round, and reactive to light.  Neck:     Vascular: No carotid bruit.  Cardiovascular:     Rate and Rhythm: Normal rate and regular rhythm.     Heart sounds: Normal heart sounds. No murmur. No gallop.   Pulmonary:     Effort: Pulmonary effort is normal. No respiratory distress.     Breath sounds: Normal breath sounds. No wheezing or rhonchi.  Abdominal:     General: Abdomen is flat. Bowel sounds are normal. There is no distension.     Palpations: Abdomen is soft.     Tenderness: There is no abdominal tenderness.  Genitourinary:    Comments: DRE deferred today using shared decision making Musculoskeletal:        General: No swelling or tenderness. Normal range of motion.     Cervical back: Normal range of motion and neck supple. No tenderness.     Right lower leg: Edema (chronic per patient, goes away with elevation) present.     Left lower leg: Edema (chronic  per patient, goes away with elevation) present.  Skin:    General: Skin is warm and dry.     Capillary Refill: Capillary refill takes less than 2 seconds.     Coloration: Skin is not jaundiced or pale.  Neurological:     General: No focal deficit present.     Mental Status: He is alert and oriented to person, place, and time.     Motor: No weakness.     Gait: Gait normal.  Psychiatric:        Mood and Affect: Mood normal.        Behavior: Behavior normal.        Thought Content: Thought content normal.        Judgment: Judgment normal.       Assessment & Plan:   Problem List Items Addressed This Visit      Cardiovascular and Mediastinum   Essential hypertension    Chronic, stable.  BP at goal in office today.  Continue to monitor BP at home and continue current regimen of amlodipine 10 mg, losartan 100 mg, and HCTZ 25 mg daily.  CBC, CMP checked today.      Relevant Medications   amLODipine (NORVASC) 10 MG tablet   hydrochlorothiazide (HYDRODIURIL) 25 MG tablet   losartan (COZAAR) 100 MG tablet   Other Relevant Orders   CBC with Differential/Platelet   Comprehensive metabolic panel   Lipid Panel w/o Chol/HDL Ratio     Endocrine   Impaired fasting glucose    Chronic, on labs in the past.  Will check A1c today.  Continue to work on dietary and lifestyle modifications for now.      Relevant Orders   HgB A1c    Other Visit Diagnoses    Annual physical exam    -  Primary   Relevant Orders   CBC with Differential/Platelet   Comprehensive metabolic panel   Lipid Panel w/o Chol/HDL Ratio   PSA   TSH  Discussed aspirin prophylaxis for myocardial infarction prevention and decision was it was not indicated  LABORATORY TESTING:  Health maintenance labs ordered today as discussed above.   The natural history of prostate cancer and ongoing controversy regarding screening and potential treatment outcomes of prostate cancer has been discussed with the patient. The  meaning of a false positive PSA and a false negative PSA has been discussed. He indicates understanding of the limitations of this screening test and wishes to proceed with screening PSA testing.   IMMUNIZATIONS:   - Tdap: Tetanus vaccination status reviewed: last tetanus booster within 10 years. - Influenza: Postponed to flu season - Pneumovax: Not applicable - Prevnar: Not applicable - HPV: Not applicable - Zostavax vaccine: Not applicable  SCREENING: - Colonoscopy: Up to date  Discussed with patient purpose of the colonoscopy is to detect colon cancer at curable precancerous or early stages   - AAA Screening: Not applicable  -Hearing Test: Not applicable  -Spirometry: Not applicable   PATIENT COUNSELING:    Sexuality: Discussed sexually transmitted diseases, partner selection, use of condoms, avoidance of unintended pregnancy  and contraceptive alternatives.   Advised to avoid cigarette smoking.  I discussed with the patient that most people either abstain from alcohol or drink within safe limits (<=14/week and <=4 drinks/occasion for males, <=7/weeks and <= 3 drinks/occasion for females) and that the risk for alcohol disorders and other health effects rises proportionally with the number of drinks per week and how often a drinker exceeds daily limits.  Discussed cessation/primary prevention of drug use and availability of treatment for abuse.   Diet: Encouraged to adjust caloric intake to maintain  or achieve ideal body weight, to reduce intake of dietary saturated fat and total fat, to limit sodium intake by avoiding high sodium foods and not adding table salt, and to maintain adequate dietary potassium and calcium preferably from fresh fruits, vegetables, and low-fat dairy products.    stressed the importance of regular exercise  Injury prevention: Discussed safety belts, safety helmets, smoke detector, smoking near bedding or upholstery.   Dental health: Discussed importance  of regular tooth brushing, flossing, and dental visits.   Follow up plan: NEXT PREVENTATIVE PHYSICAL DUE IN 1 YEAR. Return in about 1 year (around 07/13/2020) for complete physical examination.

## 2019-07-14 NOTE — Assessment & Plan Note (Addendum)
Chronic, on labs in the past.  Will check A1c today.  Continue to work on dietary and lifestyle modifications for now.

## 2019-07-15 LAB — COMPREHENSIVE METABOLIC PANEL
ALT: 28 IU/L (ref 0–44)
AST: 22 IU/L (ref 0–40)
Albumin/Globulin Ratio: 1.6 (ref 1.2–2.2)
Albumin: 4.5 g/dL (ref 3.8–4.9)
Alkaline Phosphatase: 64 IU/L (ref 39–117)
BUN/Creatinine Ratio: 9 — ABNORMAL LOW (ref 10–24)
BUN: 10 mg/dL (ref 8–27)
Bilirubin Total: 0.8 mg/dL (ref 0.0–1.2)
CO2: 23 mmol/L (ref 20–29)
Calcium: 10.1 mg/dL (ref 8.6–10.2)
Chloride: 103 mmol/L (ref 96–106)
Creatinine, Ser: 1.08 mg/dL (ref 0.76–1.27)
GFR calc Af Amer: 86 mL/min/{1.73_m2} (ref >59)
GFR calc non Af Amer: 74 mL/min/{1.73_m2} (ref >59)
Globulin, Total: 2.8 g/dL (ref 1.5–4.5)
Glucose: 122 mg/dL — ABNORMAL HIGH (ref 65–99)
Potassium: 4.4 mmol/L (ref 3.5–5.2)
Sodium: 140 mmol/L (ref 134–144)
Total Protein: 7.3 g/dL (ref 6.0–8.5)

## 2019-07-15 LAB — CBC WITH DIFFERENTIAL/PLATELET
Basophils Absolute: 0 10*3/uL (ref 0.0–0.2)
Basos: 1 %
EOS (ABSOLUTE): 0.1 10*3/uL (ref 0.0–0.4)
Eos: 3 %
Hematocrit: 48.4 % (ref 37.5–51.0)
Hemoglobin: 16.5 g/dL (ref 13.0–17.7)
Immature Grans (Abs): 0 10*3/uL (ref 0.0–0.1)
Immature Granulocytes: 0 %
Lymphocytes Absolute: 1.3 10*3/uL (ref 0.7–3.1)
Lymphs: 25 %
MCH: 31.2 pg (ref 26.6–33.0)
MCHC: 34.1 g/dL (ref 31.5–35.7)
MCV: 92 fL (ref 79–97)
Monocytes Absolute: 0.4 10*3/uL (ref 0.1–0.9)
Monocytes: 8 %
Neutrophils Absolute: 3.2 10*3/uL (ref 1.4–7.0)
Neutrophils: 63 %
Platelets: 186 10*3/uL (ref 150–450)
RBC: 5.29 x10E6/uL (ref 4.14–5.80)
RDW: 12.9 % (ref 11.6–15.4)
WBC: 5.1 10*3/uL (ref 3.4–10.8)

## 2019-07-15 LAB — HEMOGLOBIN A1C
Est. average glucose Bld gHb Est-mCnc: 114 mg/dL
Hgb A1c MFr Bld: 5.6 % (ref 4.8–5.6)

## 2019-07-15 LAB — LIPID PANEL W/O CHOL/HDL RATIO
Cholesterol, Total: 148 mg/dL (ref 100–199)
HDL: 46 mg/dL (ref >39)
LDL Chol Calc (NIH): 67 mg/dL (ref 0–99)
Triglycerides: 215 mg/dL — ABNORMAL HIGH (ref 0–149)
VLDL Cholesterol Cal: 35 mg/dL (ref 5–40)

## 2019-07-15 LAB — PSA: Prostate Specific Ag, Serum: 0.7 ng/mL (ref 0.0–4.0)

## 2019-07-15 LAB — TSH: TSH: 2.08 u[IU]/mL (ref 0.450–4.500)

## 2019-07-17 ENCOUNTER — Encounter: Payer: Self-pay | Admitting: Nurse Practitioner

## 2019-07-17 ENCOUNTER — Telehealth: Payer: Self-pay | Admitting: Nurse Practitioner

## 2019-07-17 DIAGNOSIS — R7301 Impaired fasting glucose: Secondary | ICD-10-CM

## 2019-07-17 DIAGNOSIS — E781 Pure hyperglyceridemia: Secondary | ICD-10-CM

## 2019-07-17 NOTE — Telephone Encounter (Signed)
Called patient to discuss lab work.  TSH, PSA, blood counts normal.  Extensively discussed borderline HgbA1c for prediabetes and elevated fasting blood sugar.  Also discussed elevated triglycerides and diet/lifestyle modifications.  Patient declines new medication for triglycerides now and wishes to continue to keep work on lifestyle modifications before starting new medication.  Advised on The 10-year ASCVD risk score Mikey Bussing DC Brooke Bonito., et al., 2013) is: 8.2%   Values used to calculate the score:     Age: 59 years     Sex: Male     Is Non-Hispanic African American: No     Diabetic: No     Tobacco smoker: No     Systolic Blood Pressure: [id number] mmHg     Is BP treated: Yes     HDL Cholesterol: 46 mg/dL     Total Cholesterol: 148 mg/dL   Will recheck cholesterol levels and A1c in 3-6 months, patient advised to come in to have lab work drawn during that timeframe.

## 2020-01-29 LAB — FECAL OCCULT BLOOD, GUAIAC: Fecal Occult Blood: NEGATIVE

## 2020-03-19 ENCOUNTER — Telehealth: Payer: PRIVATE HEALTH INSURANCE | Admitting: Nurse Practitioner

## 2021-03-24 ENCOUNTER — Telehealth: Payer: Self-pay | Admitting: Urology

## 2021-03-24 ENCOUNTER — Other Ambulatory Visit: Payer: Self-pay | Admitting: *Deleted

## 2021-03-24 DIAGNOSIS — N200 Calculus of kidney: Secondary | ICD-10-CM

## 2021-03-24 NOTE — Telephone Encounter (Signed)
Patient rescheduled his follow up appt and needs a KUB order prior.  Appt: 04/25/21

## 2021-04-25 ENCOUNTER — Ambulatory Visit: Payer: Self-pay | Admitting: Urology

## 2021-06-16 ENCOUNTER — Ambulatory Visit: Payer: Self-pay | Admitting: Urology

## 2021-07-04 ENCOUNTER — Ambulatory Visit: Payer: Self-pay | Admitting: Urology

## 2021-07-23 ENCOUNTER — Ambulatory Visit: Payer: Self-pay | Admitting: Urology

## 2021-08-01 ENCOUNTER — Ambulatory Visit: Payer: Commercial Managed Care - HMO | Admitting: Urology

## 2021-08-01 ENCOUNTER — Encounter: Payer: Self-pay | Admitting: Urology

## 2021-08-01 ENCOUNTER — Ambulatory Visit
Admission: RE | Admit: 2021-08-01 | Discharge: 2021-08-01 | Disposition: A | Discharge status: Home / Self Care | Payer: Commercial Managed Care - HMO | Attending: Urology | Admitting: Urology

## 2021-08-01 ENCOUNTER — Ambulatory Visit
Admission: RE | Admit: 2021-08-01 | Discharge: 2021-08-01 | Disposition: A | Discharge status: Home / Self Care | Payer: Commercial Managed Care - HMO | Source: Ambulatory Visit | Attending: Urology | Admitting: Urology

## 2021-08-01 VITALS — BP 108/72 | HR 93 | Ht 69.0 in | Wt 245.0 lb

## 2021-08-01 DIAGNOSIS — N200 Calculus of kidney: Secondary | ICD-10-CM

## 2021-08-01 DIAGNOSIS — N401 Enlarged prostate with lower urinary tract symptoms: Secondary | ICD-10-CM

## 2021-08-01 NOTE — Progress Notes (Signed)
08/01/2021 1:12 PM   [name] 1960/09/29 [id number]  Referring provider: Charlynne Cousins, MD [address] [place],  [place] [address]  Chief Complaint  Patient presents with   Nephrolithiasis    Urologic history: 1.  BPH with lower urinary tract symptoms             -TURP 04/2017   2.  Multiple bladder calculi             -Cystolitholapaxy 04/2017   3.  Nephrolithiasis             -Bilateral, small nonobstructing renal calculi  HPI: 61 y.o. male presents for follow-up visit.  No complaints since last visit No bothersome LUTS Denies dysuria, gross hematuria No flank, abdominal or pelvic pain Last PSA 06/27/2020 0.74   PMH: Past Medical History:  Diagnosis Date   Bladder calculi    Herpes, genital    History of kidney stones 05/2017   Hyperlipidemia    Hypertension    controlled   Kidney stone     Surgical History: Past Surgical History:  Procedure Laterality Date   COLONOSCOPY WITH PROPOFOL N/A 08/25/2017   Procedure: COLONOSCOPY WITH PROPOFOL;  Surgeon: Lin Landsman, MD;  Location: [facility];  Service: Endoscopy;  Laterality: N/A;   CYSTOSCOPY WITH LITHOLAPAXY N/A 05/28/2017   Procedure: CYSTOSCOPY WITH LITHOLAPAXY;  Surgeon: Abbie Sons, MD;  Location: [facility];  Service: Urology;  Laterality: N/A;   ESOPHAGOGASTRODUODENOSCOPY (EGD) WITH PROPOFOL N/A 08/25/2017   Procedure: ESOPHAGOGASTRODUODENOSCOPY (EGD) WITH PROPOFOL;  Surgeon: Lin Landsman, MD;  Location: [facility];  Service: Endoscopy;  Laterality: N/A;   HEMORRHOID SURGERY  2012   SEPTOPLASTY     TRANSURETHRAL RESECTION OF PROSTATE N/A 05/28/2017   Procedure: TRANSURETHRAL RESECTION OF THE PROSTATE (TURP);  Surgeon: Abbie Sons, MD;  Location: [facility];  Service: Urology;  Laterality: N/A;   VASECTOMY Bilateral 1998    Home Medications:  Allergies as of 08/01/2021   No Known Allergies      Medication List        Accurate as of August 01, 2021  1:12 PM. If  you have any questions, ask your nurse or doctor.          amLODipine 10 MG tablet Commonly known as: NORVASC Take 1 tablet (10 mg total) by mouth daily.   hydrochlorothiazide 25 MG tablet Commonly known as: HYDRODIURIL Take 1 tablet (25 mg total) by mouth daily.   losartan 100 MG tablet Commonly known as: COZAAR Take 1 tablet (100 mg total) by mouth daily.   sildenafil 20 MG tablet Commonly known as: REVATIO 2-5 tabs 1 hour prior to intercourse   valACYclovir 1000 MG tablet Commonly known as: VALTREX Take 1 tablet (1,000 mg total) by mouth daily.        Allergies: No Known Allergies  Family History: Family History  Problem Relation Age of Onset   Diabetes Mother    Hypertension Mother    Hypertension Father    Hematuria Neg Hx    Kidney cancer Neg Hx    Kidney disease Neg Hx    Prostate cancer Neg Hx    Sickle cell trait Neg Hx    Tuberculosis Neg Hx     Social History:  reports that he has never smoked. He has never used smokeless tobacco. He reports that he does not drink alcohol and does not use drugs.   Physical Exam: BP 108/72   Pulse 93  Ht '5\' 9"'$  (1.753 m)   Wt 245 lb (111.1 kg)   BMI 36.18 kg/m   Constitutional:  Alert and oriented, No acute distress. HEENT: [place] AT Psychiatric: Normal mood and affect.   Pertinent Imaging: KUB performed today was personally reviewed and interpreted.  Pelvic phleboliths present.  No evidence of bladder calculi.  Renal calculi not seen on today's study   Assessment & Plan:    BPH with LUTS Doing well status post TURP No evidence recurrent bladder calculi  2.  Bilateral nephrolithiasis Stable, asymptomatic Calculi not seen on today's KUB  He will follow-up in 2 years for repeat KUB.  He was instructed to call earlier for worsening urinary tract symptoms, hematuria or flank pain    Abbie Sons, MD  [facility] [address] [place], [place] [address] [phone]

## 2022-04-09 ENCOUNTER — Telehealth: Payer: Self-pay | Admitting: Gastroenterology

## 2022-04-09 NOTE — Telephone Encounter (Signed)
Dr. Ebony Cargo office called in ref to biopsy report  the would like a call back [phone]

## 2022-04-09 NOTE — Telephone Encounter (Signed)
Return Kim call with Dr. Lovie Macadamia office and states she talk to Lucien on Tuesday and she faxed over his prostate path report instead of his colonoscopy and EGD path report. Informed her I would fax the report for the path and colonoscopy with the reports to her. She said fax it attention to [facility] at [phone]. Faxed it to the number provided

## 2023-04-06 DIAGNOSIS — H40003 Preglaucoma, unspecified, bilateral: Secondary | ICD-10-CM | POA: Diagnosis not present

## 2023-07-12 DIAGNOSIS — F4323 Adjustment disorder with mixed anxiety and depressed mood: Secondary | ICD-10-CM | POA: Diagnosis not present

## 2023-07-12 DIAGNOSIS — G4700 Insomnia, unspecified: Secondary | ICD-10-CM | POA: Diagnosis not present

## 2023-07-15 DIAGNOSIS — G4700 Insomnia, unspecified: Secondary | ICD-10-CM | POA: Diagnosis not present

## 2023-07-15 DIAGNOSIS — F4323 Adjustment disorder with mixed anxiety and depressed mood: Secondary | ICD-10-CM | POA: Diagnosis not present

## 2023-09-01 DIAGNOSIS — R7309 Other abnormal glucose: Secondary | ICD-10-CM | POA: Diagnosis not present

## 2023-09-01 DIAGNOSIS — Z125 Encounter for screening for malignant neoplasm of prostate: Secondary | ICD-10-CM | POA: Diagnosis not present

## 2023-09-01 DIAGNOSIS — I10 Essential (primary) hypertension: Secondary | ICD-10-CM | POA: Diagnosis not present

## 2023-09-01 DIAGNOSIS — F4323 Adjustment disorder with mixed anxiety and depressed mood: Secondary | ICD-10-CM | POA: Diagnosis not present

## 2023-09-10 DIAGNOSIS — I10 Essential (primary) hypertension: Secondary | ICD-10-CM | POA: Diagnosis not present

## 2023-09-10 DIAGNOSIS — R7309 Other abnormal glucose: Secondary | ICD-10-CM | POA: Diagnosis not present

## 2023-09-10 DIAGNOSIS — Z125 Encounter for screening for malignant neoplasm of prostate: Secondary | ICD-10-CM | POA: Diagnosis not present

## 2023-09-29 DIAGNOSIS — H40003 Preglaucoma, unspecified, bilateral: Secondary | ICD-10-CM | POA: Diagnosis not present
